# Patient Record
Sex: Female | Born: 1990 | Race: Black or African American | Hispanic: No | Marital: Single | Smoking: Former smoker
Health system: Southern US, Community
[De-identification: ages and names within clinical notes are randomized; demographics above are authoritative.]

## PROBLEM LIST (undated history)

## (undated) ENCOUNTER — Inpatient Hospital Stay: Payer: Self-pay

## (undated) DIAGNOSIS — R87629 Unspecified abnormal cytological findings in specimens from vagina: Secondary | ICD-10-CM

## (undated) DIAGNOSIS — Z789 Other specified health status: Secondary | ICD-10-CM

## (undated) DIAGNOSIS — D649 Anemia, unspecified: Secondary | ICD-10-CM

## (undated) DIAGNOSIS — F99 Mental disorder, not otherwise specified: Secondary | ICD-10-CM

## (undated) DIAGNOSIS — J45909 Unspecified asthma, uncomplicated: Secondary | ICD-10-CM

## (undated) HISTORY — PX: NO PAST SURGERIES: SHX2092

## (undated) HISTORY — DX: Anemia, unspecified: D64.9

## (undated) HISTORY — DX: Unspecified abnormal cytological findings in specimens from vagina: R87.629

## (undated) HISTORY — DX: Unspecified asthma, uncomplicated: J45.909

## (undated) HISTORY — DX: Mental disorder, not otherwise specified: F99

---
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
## 2012-08-30 ENCOUNTER — Emergency Department: Payer: Self-pay | Admitting: Emergency Medicine

## 2012-08-30 LAB — URINALYSIS, COMPLETE
Glucose,UR: NEGATIVE mg/dL (ref 0–75)
Nitrite: NEGATIVE
Protein: 30
RBC,UR: 3 /HPF (ref 0–5)
Specific Gravity: 1.017 (ref 1.003–1.030)
Squamous Epithelial: 23

## 2012-08-30 LAB — PREGNANCY, URINE: Pregnancy Test, Urine: NEGATIVE m[IU]/mL

## 2012-08-31 ENCOUNTER — Emergency Department: Payer: Self-pay | Admitting: Emergency Medicine

## 2012-08-31 LAB — TROPONIN I: Troponin-I: <0.02 ng/mL

## 2012-08-31 LAB — COMPREHENSIVE METABOLIC PANEL
Alkaline Phosphatase: 65 U/L (ref 50–136)
Anion Gap: 8 (ref 7–16)
BUN: 12 mg/dL (ref 7–18)
Bilirubin,Total: 0.4 mg/dL (ref 0.2–1.0)
Calcium, Total: 9.3 mg/dL (ref 8.5–10.1)
Chloride: 107 mmol/L (ref 98–107)
Co2: 25 mmol/L (ref 21–32)
Creatinine: 1.02 mg/dL (ref 0.60–1.30)
EGFR (African American): >60
EGFR (Non-African Amer.): >60
Osmolality: 279 (ref 275–301)
SGOT(AST): 13 U/L — ABNORMAL LOW (ref 15–37)
Sodium: 140 mmol/L (ref 136–145)
Total Protein: 8.2 g/dL (ref 6.4–8.2)

## 2012-08-31 LAB — CBC WITH DIFFERENTIAL/PLATELET
Basophil %: 0.4 %
Eosinophil #: 0 10*3/uL (ref 0.0–0.7)
Eosinophil %: 0.7 %
Lymphocyte %: 23.3 %
MCHC: 35.4 g/dL (ref 32.0–36.0)
Neutrophil #: 3.4 10*3/uL (ref 1.4–6.5)
Neutrophil %: 63 %
Platelet: 218 10*3/uL (ref 150–440)
RBC: 3.72 10*6/uL — ABNORMAL LOW (ref 3.80–5.20)

## 2012-08-31 LAB — WET PREP, GENITAL

## 2012-08-31 LAB — GC/CHLAMYDIA PROBE AMP

## 2012-08-31 LAB — CK TOTAL AND CKMB (NOT AT ARMC): CK, Total: 58 U/L (ref 21–215)

## 2012-09-02 LAB — URINE CULTURE

## 2012-11-15 ENCOUNTER — Emergency Department: Payer: Self-pay | Admitting: Emergency Medicine

## 2012-11-15 LAB — URINALYSIS, COMPLETE
Bilirubin,UR: NEGATIVE
Blood: NEGATIVE
Ketone: NEGATIVE
Leukocyte Esterase: NEGATIVE
Ph: 7 (ref 4.5–8.0)
Protein: 30
Specific Gravity: 1.023 (ref 1.003–1.030)

## 2012-11-18 ENCOUNTER — Emergency Department: Payer: Self-pay | Admitting: Emergency Medicine

## 2012-11-18 LAB — CBC
HGB: 11.8 g/dL — ABNORMAL LOW (ref 12.0–16.0)
MCH: 31.8 pg (ref 26.0–34.0)
MCV: 90 fL (ref 80–100)
Platelet: 275 10*3/uL (ref 150–440)
RBC: 3.73 10*6/uL — ABNORMAL LOW (ref 3.80–5.20)
RDW: 11.9 % (ref 11.5–14.5)

## 2012-11-18 LAB — URINALYSIS, COMPLETE
Blood: NEGATIVE
Glucose,UR: NEGATIVE mg/dL (ref 0–75)
Nitrite: NEGATIVE
Protein: NEGATIVE
RBC,UR: <1 /HPF (ref 0–5)
Specific Gravity: 1.008 (ref 1.003–1.030)
WBC UR: 3 /HPF (ref 0–5)

## 2012-11-18 LAB — COMPREHENSIVE METABOLIC PANEL
Albumin: 3.6 g/dL (ref 3.4–5.0)
Alkaline Phosphatase: 61 U/L (ref 50–136)
Anion Gap: 10 (ref 7–16)
Chloride: 110 mmol/L — ABNORMAL HIGH (ref 98–107)
Co2: 23 mmol/L (ref 21–32)
Creatinine: 0.63 mg/dL (ref 0.60–1.30)
EGFR (Non-African Amer.): >60
Potassium: 3.4 mmol/L — ABNORMAL LOW (ref 3.5–5.1)
SGPT (ALT): 11 U/L — ABNORMAL LOW (ref 12–78)
Total Protein: 7.7 g/dL (ref 6.4–8.2)

## 2012-11-18 LAB — ETHANOL: Ethanol: 141 mg/dL

## 2012-11-18 LAB — DRUG SCREEN, URINE
Amphetamines, Ur Screen: NEGATIVE (ref ?–1000)
Benzodiazepine, Ur Scrn: NEGATIVE (ref ?–200)
Cocaine Metabolite,Ur ~~LOC~~: NEGATIVE (ref ?–300)
MDMA (Ecstasy)Ur Screen: NEGATIVE (ref ?–500)
Opiate, Ur Screen: NEGATIVE (ref ?–300)
Phencyclidine (PCP) Ur S: NEGATIVE (ref ?–25)

## 2013-02-20 ENCOUNTER — Emergency Department: Payer: Self-pay | Admitting: Internal Medicine

## 2013-02-20 LAB — CBC WITH DIFFERENTIAL/PLATELET
Basophil #: 0 10*3/uL (ref 0.0–0.1)
Basophil %: 0.5 %
Lymphocyte #: 1.5 10*3/uL (ref 1.0–3.6)
Lymphocyte %: 31.1 %
MCH: 31.5 pg (ref 26.0–34.0)
MCV: 91 fL (ref 80–100)
Platelet: 200 10*3/uL (ref 150–440)
WBC: 4.9 10*3/uL (ref 3.6–11.0)

## 2013-02-20 LAB — URINALYSIS, COMPLETE
Bacteria: NONE SEEN
Bilirubin,UR: NEGATIVE
Blood: NEGATIVE
Ketone: NEGATIVE
Protein: NEGATIVE
RBC,UR: 1 /HPF (ref 0–5)
Squamous Epithelial: 6
WBC UR: 5 /HPF (ref 0–5)

## 2013-02-20 LAB — COMPREHENSIVE METABOLIC PANEL
Alkaline Phosphatase: 55 U/L
Anion Gap: 6 — ABNORMAL LOW (ref 7–16)
BUN: 9 mg/dL (ref 7–18)
Bilirubin,Total: 0.2 mg/dL (ref 0.2–1.0)
Calcium, Total: 8.6 mg/dL (ref 8.5–10.1)
Chloride: 106 mmol/L (ref 98–107)
Co2: 27 mmol/L (ref 21–32)
Creatinine: 0.54 mg/dL — ABNORMAL LOW (ref 0.60–1.30)
EGFR (African American): >60
EGFR (Non-African Amer.): >60
Potassium: 3.6 mmol/L (ref 3.5–5.1)
SGOT(AST): 10 U/L — ABNORMAL LOW (ref 15–37)
SGPT (ALT): 8 U/L — ABNORMAL LOW (ref 12–78)
Total Protein: 7.6 g/dL (ref 6.4–8.2)

## 2013-03-02 ENCOUNTER — Emergency Department: Payer: Self-pay | Admitting: Internal Medicine

## 2013-10-07 ENCOUNTER — Emergency Department: Payer: Self-pay | Admitting: Emergency Medicine

## 2013-10-07 LAB — URINALYSIS, COMPLETE
BACTERIA: NONE SEEN
BILIRUBIN, UR: NEGATIVE
Blood: NEGATIVE
Glucose,UR: NEGATIVE mg/dL (ref 0–75)
Ketone: NEGATIVE
Nitrite: NEGATIVE
PROTEIN: NEGATIVE
Ph: 6 (ref 4.5–8.0)
RBC,UR: 5 /HPF (ref 0–5)
SPECIFIC GRAVITY: 1.024 (ref 1.003–1.030)
Squamous Epithelial: 13
WBC UR: 8 /HPF (ref 0–5)

## 2013-10-07 LAB — GC/CHLAMYDIA PROBE AMP

## 2013-10-07 LAB — WET PREP, GENITAL

## 2014-01-06 ENCOUNTER — Emergency Department: Payer: Self-pay | Admitting: Emergency Medicine

## 2014-01-10 ENCOUNTER — Ambulatory Visit: Payer: Self-pay | Admitting: Family Medicine

## 2014-06-11 ENCOUNTER — Emergency Department: Payer: Self-pay | Admitting: Emergency Medicine

## 2014-06-11 LAB — CBC
HCT: 35.1 %
HGB: 11.7 g/dL — ABNORMAL LOW
MCH: 31.5 pg
MCHC: 33.4 g/dL
MCV: 94 fL
Platelet: 217 x10 3/mm 3
RBC: 3.72 X10 6/mm 3 — ABNORMAL LOW
RDW: 12.2 %
WBC: 8.3 x10 3/mm 3

## 2014-06-11 LAB — URINALYSIS, COMPLETE
Bilirubin,UR: NEGATIVE
Blood: NEGATIVE
Glucose,UR: NEGATIVE mg/dL
Leukocyte Esterase: NEGATIVE
Nitrite: POSITIVE
Ph: 8
Protein: 100
RBC,UR: 2 /HPF
Specific Gravity: 1.027
Squamous Epithelial: 4
WBC UR: 2 /HPF

## 2014-06-12 LAB — RAPID INFLUENZA A&B ANTIGENS

## 2014-06-13 LAB — URINE CULTURE

## 2014-09-03 ENCOUNTER — Other Ambulatory Visit: Payer: Self-pay

## 2014-09-03 ENCOUNTER — Emergency Department
Admission: EM | Admit: 2014-09-03 | Discharge: 2014-09-03 | Disposition: A | Discharge status: Home / Self Care | Payer: BLUE CROSS/BLUE SHIELD | Attending: Emergency Medicine | Admitting: Emergency Medicine

## 2014-09-03 DIAGNOSIS — R1110 Vomiting, unspecified: Secondary | ICD-10-CM | POA: Diagnosis not present

## 2014-09-03 DIAGNOSIS — Z3202 Encounter for pregnancy test, result negative: Secondary | ICD-10-CM | POA: Insufficient documentation

## 2014-09-03 DIAGNOSIS — F410 Panic disorder [episodic paroxysmal anxiety] without agoraphobia: Secondary | ICD-10-CM | POA: Diagnosis present

## 2014-09-03 LAB — BASIC METABOLIC PANEL
Anion gap: 2 — ABNORMAL LOW (ref 5–15)
BUN: 11 mg/dL (ref 6–20)
CO2: 25 mmol/L (ref 22–32)
Calcium: 9.1 mg/dL (ref 8.9–10.3)
Chloride: 112 mmol/L — ABNORMAL HIGH (ref 101–111)
Creatinine, Ser: 0.65 mg/dL (ref 0.44–1.00)
GFR calc Af Amer: >60 mL/min (ref >60)
GFR calc non Af Amer: >60 mL/min (ref >60)
Glucose, Bld: 95 mg/dL (ref 65–99)
Potassium: 3.7 mmol/L (ref 3.5–5.1)
SODIUM: 139 mmol/L (ref 135–145)

## 2014-09-03 LAB — CBC
HCT: 34.9 % — ABNORMAL LOW (ref 35.0–47.0)
HEMOGLOBIN: 11.7 g/dL — AB (ref 12.0–16.0)
MCH: 30.8 pg (ref 26.0–34.0)
MCHC: 33.5 g/dL (ref 32.0–36.0)
MCV: 92.1 fL (ref 80.0–100.0)
Platelets: 248 10*3/uL (ref 150–440)
RBC: 3.79 MIL/uL — AB (ref 3.80–5.20)
RDW: 12.6 % (ref 11.5–14.5)
WBC: 4.9 10*3/uL (ref 3.6–11.0)

## 2014-09-03 LAB — POCT PREGNANCY, URINE: Preg Test, Ur: NEGATIVE

## 2014-09-03 NOTE — ED Provider Notes (Signed)
[facility] Emergency Department Provider Note    ____________________________________________  Time seen: 1520  I have reviewed the triage vital signs and the nursing notes.   HISTORY  Chief Complaint Emesis; Panic Attack; Extremity Weakness; and Headache   History limited by: Not Limited   HPI Kelly Roberts is a 24 y.o. female who presents to the emergency department after panic attack. Patient states she was at work when she started developing some chest tightness and shortness of breath. She did have one episode of emesis. This came on suddenly. It lasted roughly 30 minutes. She states she has had panic attacks in the past. Currently she states she feels much improved. No obvious trigger for today's episode. Denies any recent illnesses.     No past medical history on file.  There are no active problems to display for this patient.   No past surgical history on file.  No current outpatient prescriptions on file.  Allergies Review of patient's allergies indicates no known allergies.  No family history on file.  Social History History  Substance Use Topics  . Smoking status: Not on file  . Smokeless tobacco: Not on file  . Alcohol Use: Not on file    Review of Systems  Constitutional: Negative for fever. Cardiovascular: Positive for chest tightness Respiratory: Positive for shortness of breath. Gastrointestinal: Positive for one episode of emesis Genitourinary: Negative for dysuria. Musculoskeletal: Negative for back pain. Skin: Negative for rash. Neurological: Negative for headaches, focal weakness or numbness.   10-point ROS otherwise negative.  ____________________________________________   PHYSICAL EXAM:  VITAL SIGNS: ED Triage Vitals  Enc Vitals Group     BP 09/03/14 1033 102/60 mmHg     Pulse Rate 09/03/14 1033 84     Resp 09/03/14 1033 18     Temp 09/03/14 1033 98.7 F (37.1 C)     Temp Source 09/03/14 1033  Oral     SpO2 09/03/14 1033 99 %     Weight 09/03/14 1033 110 lb (49.896 kg)     Height 09/03/14 1033 4\' 8"  (1.422 m)     Head Cir --      Peak Flow --      Pain Score --      Pain Loc --      Pain Edu? --      Excl. in GC? --      Constitutional: Alert and oriented. Well appearing and in no distress. Eyes: Conjunctivae are normal. PERRL. Normal extraocular movements. ENT   Head: Normocephalic and atraumatic.   Nose: No congestion/rhinnorhea.   Mouth/Throat: Mucous membranes are moist.   Neck: No stridor. Hematological/Lymphatic/Immunilogical: No cervical lymphadenopathy. Cardiovascular: Normal rate, regular rhythm.  No murmurs, rubs, or gallops. Respiratory: Normal respiratory effort without tachypnea nor retractions. Breath sounds are clear and equal bilaterally. No wheezes/rales/rhonchi. Gastrointestinal: Soft and nontender. No distention.  Genitourinary: Deferred Musculoskeletal: Normal range of motion in all extremities. No joint effusions.  No lower extremity tenderness nor edema. Neurologic:  Normal speech and language. No gross focal neurologic deficits are appreciated. Speech is normal.  Skin:  Skin is warm, dry and intact. No rash noted. Psychiatric: Mood and affect are normal. Speech and behavior are normal. Patient exhibits appropriate insight and judgment.  ____________________________________________    LABS (pertinent positives/negatives)  Labs Reviewed  BASIC METABOLIC PANEL - Abnormal; Notable for the following:    Chloride 112 (*)    Anion gap 2 (*)    All other components within normal  limits  CBC - Abnormal; Notable for the following:    RBC 3.79 (*)    Hemoglobin 11.7 (*)    HCT 34.9 (*)    All other components within normal limits  POCT PREGNANCY, URINE  POC URINE PREG, ED     ____________________________________________   EKG  I, Phineas Semen, attending physician, personally viewed and interpreted this EKG  EKG Time:  1055 Rate: 73 Rhythm: Normal sinus rhythm Axis: Normal Intervals: QTC 418 QRS: Narrow ST changes: No ST elevation    ____________________________________________    RADIOLOGY  None  ____________________________________________   PROCEDURES  Procedure(s) performed: None  Critical Care performed: No  ____________________________________________   INITIAL IMPRESSION / ASSESSMENT AND PLAN / ED COURSE  Pertinent labs & imaging results that were available during my care of the patient were reviewed by me and considered in my medical decision making (see chart for details).  Patient presents after panic attack today at work. On exam here no concerning findings. Blood work without any concerning findings. Discussed panic attacks with patient. Will discharge home, will give [facility] follow-up. Encourage primary care follow-up.  ____________________________________________   FINAL CLINICAL IMPRESSION(S) / ED DIAGNOSES  Final diagnoses:  Panic attack     Phineas Semen, MD 09/03/14 1544

## 2014-09-03 NOTE — ED Notes (Signed)
Pt eating chips. 

## 2014-09-03 NOTE — ED Notes (Signed)
Patient was at work when she had an episode of vomiting followed by a panic attack. Patient states that she is now ShOB, with left arm weakness, and a headache

## 2014-09-03 NOTE — ED Notes (Signed)
Had panic attack at work, still has some sob , weakness in left arm, c/o some headache

## 2014-09-03 NOTE — Discharge Instructions (Signed)
Please seek medical attention for any high fevers, chest pain, shortness of breath, change in behavior, persistent vomiting, bloody stool or any other new or concerning symptoms.  Panic Attacks Panic attacks are sudden, short-livedsurges of severe anxiety, fear, or discomfort. They may occur for no reason when you are relaxed, when you are anxious, or when you are sleeping. Panic attacks may occur for a number of reasons:   Healthy people occasionally have panic attacks in extreme, life-threatening situations, such as war or natural disasters. Normal anxiety is a protective mechanism of the body that helps [place] react to danger (fight or flight response).  Panic attacks are often seen with anxiety disorders, such as panic disorder, social anxiety disorder, generalized anxiety disorder, and phobias. Anxiety disorders cause excessive or uncontrollable anxiety. They may interfere with your relationships or other life activities.  Panic attacks are sometimes seen with other mental illnesses, such as depression and posttraumatic stress disorder.  Certain medical conditions, prescription medicines, and drugs of abuse can cause panic attacks. SYMPTOMS  Panic attacks start suddenly, peak within 20 minutes, and are accompanied by four or more of the following symptoms:  Pounding heart or fast heart rate (palpitations).  Sweating.  Trembling or shaking.  Shortness of breath or feeling smothered.  Feeling choked.  Chest pain or discomfort.  Nausea or strange feeling in your stomach.  Dizziness, light-headedness, or feeling like you will faint.  Chills or hot flushes.  Numbness or tingling in your lips or hands and feet.  Feeling that things are not real or feeling that you are not yourself.  Fear of losing control or going crazy.  Fear of dying. Some of these symptoms can mimic serious medical conditions. For example, you may think you are having a heart attack. Although panic attacks can  be very scary, they are not life threatening. DIAGNOSIS  Panic attacks are diagnosed through an assessment by your health care provider. Your health care provider will ask questions about your symptoms, such as where and when they occurred. Your health care provider will also ask about your medical history and use of alcohol and drugs, including prescription medicines. Your health care provider may order blood tests or other studies to rule out a serious medical condition. Your health care provider may refer you to a mental health professional for further evaluation. TREATMENT   Most healthy people who have one or two panic attacks in an extreme, life-threatening situation will not require treatment.  The treatment for panic attacks associated with anxiety disorders or other mental illness typically involves counseling with a mental health professional, medicine, or a combination of both. Your health care provider will help determine what treatment is best for you.  Panic attacks due to physical illness usually go away with treatment of the illness. If prescription medicine is causing panic attacks, talk with your health care provider about stopping the medicine, decreasing the dose, or substituting another medicine.  Panic attacks due to alcohol or drug abuse go away with abstinence. Some adults need professional help in order to stop drinking or using drugs. HOME CARE INSTRUCTIONS   Take all medicines as directed by your health care provider.   Schedule and attend follow-up visits as directed by your health care provider. It is important to keep all your appointments. SEEK MEDICAL CARE IF:  You are not able to take your medicines as prescribed.  Your symptoms do not improve or get worse. SEEK IMMEDIATE MEDICAL CARE IF:   You experience panic  attack symptoms that are different than your usual symptoms.  You have serious thoughts about hurting yourself or others.  You are taking medicine  for panic attacks and have a serious side effect. MAKE SURE YOU:  Understand these instructions.  Will watch your condition.  Will get help right away if you are not doing well or get worse. Document Released: 03/07/2005 Document Revised: 03/12/2013 Document Reviewed: 10/19/2012 [facility] Patient Information 2015 [employer] [place]. This information is not intended to replace advice given to you by your health care provider. Make sure you discuss any questions you have with your health care provider.

## 2014-10-05 ENCOUNTER — Emergency Department
Admission: EM | Admit: 2014-10-05 | Discharge: 2014-10-05 | Disposition: A | Discharge status: Home / Self Care | Payer: BLUE CROSS/BLUE SHIELD | Attending: Emergency Medicine | Admitting: Emergency Medicine

## 2014-10-05 ENCOUNTER — Emergency Department: Payer: BLUE CROSS/BLUE SHIELD

## 2014-10-05 ENCOUNTER — Encounter: Payer: Self-pay | Admitting: Emergency Medicine

## 2014-10-05 DIAGNOSIS — R10813 Right lower quadrant abdominal tenderness: Secondary | ICD-10-CM | POA: Insufficient documentation

## 2014-10-05 DIAGNOSIS — R10814 Left lower quadrant abdominal tenderness: Secondary | ICD-10-CM | POA: Insufficient documentation

## 2014-10-05 DIAGNOSIS — Z331 Pregnant state, incidental: Secondary | ICD-10-CM | POA: Insufficient documentation

## 2014-10-05 DIAGNOSIS — R109 Unspecified abdominal pain: Secondary | ICD-10-CM | POA: Diagnosis present

## 2014-10-05 DIAGNOSIS — Z3490 Encounter for supervision of normal pregnancy, unspecified, unspecified trimester: Secondary | ICD-10-CM

## 2014-10-05 LAB — URINALYSIS COMPLETE WITH MICROSCOPIC (ARMC ONLY)
BILIRUBIN URINE: NEGATIVE
Bacteria, UA: NONE SEEN
GLUCOSE, UA: NEGATIVE mg/dL
Hgb urine dipstick: NEGATIVE
Ketones, ur: NEGATIVE mg/dL
LEUKOCYTES UA: NEGATIVE
Nitrite: NEGATIVE
Protein, ur: 30 mg/dL — AB
RBC / HPF: NONE SEEN RBC/hpf (ref 0–5)
Specific Gravity, Urine: 1.031 — ABNORMAL HIGH (ref 1.005–1.030)
WBC, UA: NONE SEEN WBC/hpf (ref 0–5)
pH: 5 (ref 5.0–8.0)

## 2014-10-05 LAB — HCG, QUANTITATIVE, PREGNANCY: hCG, Beta Chain, Quant, S: 13967 m[IU]/mL — ABNORMAL HIGH (ref <5)

## 2014-10-05 MED ORDER — PROMETHAZINE HCL 25 MG PO TABS: 25.0000 mg | ORAL_TABLET | Freq: Four times a day (QID) | ORAL | Status: DC | PRN

## 2014-10-05 NOTE — ED Notes (Signed)
Pt presents to the ER from home with complaints of lower abdominal cramping for about 3 days. Pt reports last time she had her period was 09/18/14

## 2014-10-05 NOTE — ED Provider Notes (Signed)
[facility] Emergency Department Provider Note  ____________________________________________  Time seen: Approximately 10:30 AM  I have reviewed the triage vital signs and the nursing notes.   HISTORY  Chief Complaint Abdominal Cramping and Possible Pregnancy   HPI Kelly Roberts is a 24 y.o. female presents emergency room with complaints of lower abdominal cramping for about 3 days. Patient states that her last period was approximately 08/23/2014. Currently 4 weeks IUP diagnosed by the health department complaints of severe pain. Denies any vaginal bleeding at this time.   History reviewed. No pertinent past medical history.  There are no active problems to display for this patient.   History reviewed. No pertinent past surgical history.  Current Outpatient Rx  Name  Route  Sig  Dispense  Refill  . promethazine (PHENERGAN) 25 MG tablet   Oral   Take 1 tablet (25 mg total) by mouth every 6 (six) hours as needed for nausea or vomiting.   10 tablet   0     Allergies Review of patient's allergies indicates no known allergies.  No family history on file.  Social History History  Substance Use Topics  . Smoking status: Never Smoker   . Smokeless tobacco: Not on file  . Alcohol Use: Not on file    Review of Systems Constitutional: No fever/chills Eyes: No visual changes. ENT: No sore throat. Cardiovascular: Denies chest pain. Respiratory: Denies shortness of breath. Gastrointestinal: Positive for abdominal pain.  No nausea, no vomiting.  No diarrhea.  No constipation. Genitourinary: Negative for dysuria. Musculoskeletal: Negative for back pain. Skin: Negative for rash. Neurological: Negative for headaches, focal weakness or numbness.  10-point ROS otherwise negative.  ____________________________________________   PHYSICAL EXAM:  VITAL SIGNS: ED Triage Vitals  Enc Vitals Group     BP 10/05/14 0950 107/52 mmHg     Pulse Rate  10/05/14 0950 89     Resp 10/05/14 0950 20     Temp 10/05/14 0950 98.1 F (36.7 C)     Temp Source 10/05/14 0950 Oral     SpO2 10/05/14 0950 99 %     Weight 10/05/14 0950 110 lb (49.896 kg)     Height 10/05/14 0950 4\' 8"  (1.422 m)     Head Cir --      Peak Flow --      Pain Score 10/05/14 0951 5     Pain Loc --      Pain Edu? --      Excl. in GC? --     Constitutional: Alert and oriented. Well appearing and in no acute distress. Eyes: Conjunctivae are normal. PERRL. EOMI. Head: Atraumatic. Nose: No congestion/rhinnorhea. Mouth/Throat: Mucous membranes are moist.  Oropharynx non-erythematous. Neck: No stridor.   Cardiovascular: Normal rate, regular rhythm. Grossly normal heart sounds.  Good peripheral circulation. Respiratory: Normal respiratory effort.  No retractions. Lungs CTAB. Gastrointestinal: Soft. Tender inright lower quadrant and left lower quadrant.. No distention. No abdominal bruits. No CVA tenderness. Genitourinary: Deferred Musculoskeletal: No lower extremity tenderness nor edema.  No joint effusions. Neurologic:  Normal speech and language. No gross focal neurologic deficits are appreciated. No gait instability. Skin:  Skin is warm, dry and intact. No rash noted. Psychiatric: Mood and affect are normal. Speech and behavior are normal.  ____________________________________________   LABS (all labs ordered are listed, but only abnormal results are displayed)  Labs Reviewed  URINALYSIS COMPLETEWITH MICROSCOPIC ([facility] ONLY) - Abnormal; Notable for the following:    Color, Urine YELLOW (*)  APPearance CLEAR (*)    Specific Gravity, Urine 1.031 (*)    Protein, ur 30 (*)    Squamous Epithelial / LPF 0-5 (*)    All other components within normal limits  HCG, QUANTITATIVE, PREGNANCY - Abnormal; Notable for the following:    hCG, Beta Chain, Quant, S [id number] (*)    All other components within normal limits  POC URINE PREG, ED    ____________________________________________   RADIOLOGY  Transvaginal ultrasound demonstrates intact sac for pregnancy. Interpreted by radiologist ____________________________________________   PROCEDURES  Procedure(s) performed: None  Critical Care performed: No  ____________________________________________   INITIAL IMPRESSION / ASSESSMENT AND PLAN / ED COURSE  Pertinent labs & imaging results that were available during my care of the patient were reviewed by me and considered in my medical decision making (see chart for details).  Intrauterine pregnancy confirmed by quantitative hCG and ultrasound. Patient to repeat ultrasound in 14 days to confirm continuation of pregnancy. She is scheduled to see [facility] within the next 30 days. She denies any other complaints at this time will discharge her with a prescription for Phenergan to use as needed for nausea. Encouraged Tylenol for cramping. Patient voices understanding and will return to the ER with any worsening symptomology. ____________________________________________   FINAL CLINICAL IMPRESSION(S) / ED DIAGNOSES  Final diagnoses:  Pregnancy      Evangeline Dakin, PA-C 10/05/14 1358  Darci Current, MD 10/07/14 [phone]

## 2014-10-05 NOTE — Discharge Instructions (Signed)
First Trimester of Pregnancy °The first trimester of pregnancy is from week 1 until the end of week 12 (months 1 through 3). A week after a sperm fertilizes an egg, the egg will implant on the wall of the uterus. This embryo will begin to develop into a baby. Genes from you and your partner are forming the baby. The female genes determine whether the baby is a boy or a girl. At 6-8 weeks, the eyes and face are formed, and the heartbeat can be seen on ultrasound. At the end of 12 weeks, all the baby's organs are formed.  °Now that you are pregnant, you will want to do everything you can to have a healthy baby. Two of the most important things are to get good prenatal care and to follow your health care provider's instructions. Prenatal care is all the medical care you receive before the baby's birth. This care will help prevent, find, and treat any problems during the pregnancy and childbirth. °BODY CHANGES °Your body goes through many changes during pregnancy. The changes vary from woman to woman.  °· You may gain or lose a couple of pounds at first. °· You may feel sick to your stomach (nauseous) and throw up (vomit). If the vomiting is uncontrollable, call your health care provider. °· You may tire easily. °· You may develop headaches that can be relieved by medicines approved by your health care provider. °· You may urinate more often. Painful urination may mean you have a bladder infection. °· You may develop heartburn as a result of your pregnancy. °· You may develop constipation because certain hormones are causing the muscles that push waste through your intestines to slow down. °· You may develop hemorrhoids or swollen, bulging veins (varicose veins). °· Your breasts may begin to grow larger and become tender. Your nipples may stick out more, and the tissue that surrounds them (areola) may become darker. °· Your gums may bleed and may be sensitive to brushing and flossing. °· Dark spots or blotches (chloasma,  mask of pregnancy) may develop on your face. This will likely fade after the baby is born. °· Your menstrual periods will stop. °· You may have a loss of appetite. °· You may develop cravings for certain kinds of food. °· You may have changes in your emotions from day to day, such as being excited to be pregnant or being concerned that something may go wrong with the pregnancy and baby. °· You may have more vivid and strange dreams. °· You may have changes in your hair. These can include thickening of your hair, rapid growth, and changes in texture. Some women also have hair loss during or after pregnancy, or hair that feels dry or thin. Your hair will most likely return to normal after your baby is born. °WHAT TO EXPECT AT YOUR PRENATAL VISITS °During a routine prenatal visit: °· You will be weighed to make sure you and the baby are growing normally. °· Your blood pressure will be taken. °· Your abdomen will be measured to track your baby's growth. °· The fetal heartbeat will be listened to starting around week 10 or 12 of your pregnancy. °· Test results from any previous visits will be discussed. °Your health care provider may ask you: °· How you are feeling. °· If you are feeling the baby move. °· If you have had any abnormal symptoms, such as leaking fluid, bleeding, severe headaches, or abdominal cramping. °· If you have any questions. °Other tests   that may be performed during your first trimester include: °· Blood tests to find your blood type and to check for the presence of any previous infections. They will also be used to check for low iron levels (anemia) and Rh antibodies. Later in the pregnancy, blood tests for diabetes will be done along with other tests if problems develop. °· Urine tests to check for infections, diabetes, or protein in the urine. °· An ultrasound to confirm the proper growth and development of the baby. °· An amniocentesis to check for possible genetic problems. °· Fetal screens for  spina bifida and Down syndrome. °· You may need other tests to make sure you and the baby are doing well. °HOME CARE INSTRUCTIONS  °Medicines °· Follow your health care provider's instructions regarding medicine use. Specific medicines may be either safe or unsafe to take during pregnancy. °· Take your prenatal vitamins as directed. °· If you develop constipation, try taking a stool softener if your health care provider approves. °Diet °· Eat regular, well-balanced meals. Choose a variety of foods, such as meat or vegetable-based protein, fish, milk and low-fat dairy products, vegetables, fruits, and whole grain breads and cereals. Your health care provider will help you determine the amount of weight gain that is right for you. °· Avoid raw meat and uncooked cheese. These carry germs that can cause birth defects in the baby. °· Eating four or five small meals rather than three large meals a day may help relieve nausea and vomiting. If you start to feel nauseous, eating a few soda crackers can be helpful. Drinking liquids between meals instead of during meals also seems to help nausea and vomiting. °· If you develop constipation, eat more high-fiber foods, such as fresh vegetables or fruit and whole grains. Drink enough fluids to keep your urine clear or pale yellow. °Activity and Exercise °· Exercise only as directed by your health care provider. Exercising will help you: °¨ Control your weight. °¨ Stay in shape. °¨ Be prepared for labor and delivery. °· Experiencing pain or cramping in the lower abdomen or low back is a good sign that you should stop exercising. Check with your health care provider before continuing normal exercises. °· Try to avoid standing for long periods of time. Move your legs often if you must stand in one place for a long time. °· Avoid heavy lifting. °· Wear low-heeled shoes, and practice good posture. °· You may continue to have sex unless your health care provider directs you  otherwise. °Relief of Pain or Discomfort °· Wear a good support bra for breast tenderness.   °· Take warm sitz baths to soothe any pain or discomfort caused by hemorrhoids. Use hemorrhoid cream if your health care provider approves.   °· Rest with your legs elevated if you have leg cramps or low back pain. °· If you develop varicose veins in your legs, wear support hose. Elevate your feet for 15 minutes, 3-4 times a day. Limit salt in your diet. °Prenatal Care °· Schedule your prenatal visits by the twelfth week of pregnancy. They are usually scheduled monthly at first, then more often in the last 2 months before delivery. °· Write down your questions. Take them to your prenatal visits. °· Keep all your prenatal visits as directed by your health care provider. °Safety °· Wear your seat belt at all times when driving. °· Make a list of emergency phone numbers, including numbers for family, friends, the hospital, and police and fire departments. °General Tips °·   Ask your health care provider for a referral to a local prenatal education class. Begin classes no later than at the beginning of month 6 of your pregnancy.  Ask for help if you have counseling or nutritional needs during pregnancy. Your health care provider can offer advice or refer you to specialists for help with various needs.  Do not use hot tubs, steam rooms, or saunas.  Do not douche or use tampons or scented sanitary pads.  Do not cross your legs for long periods of time.  Avoid cat litter boxes and soil used by cats. These carry germs that can cause birth defects in the baby and possibly loss of the fetus by miscarriage or stillbirth.  Avoid all smoking, herbs, alcohol, and medicines not prescribed by your health care provider. Chemicals in these affect the formation and growth of the baby.  Schedule a dentist appointment. At home, brush your teeth with a soft toothbrush and be gentle when you floss. SEEK MEDICAL CARE IF:   You have  dizziness.  You have mild pelvic cramps, pelvic pressure, or nagging pain in the abdominal area.  You have persistent nausea, vomiting, or diarrhea.  You have a bad smelling vaginal discharge.  You have pain with urination.  You notice increased swelling in your face, hands, legs, or ankles. SEEK IMMEDIATE MEDICAL CARE IF:   You have a fever.  You are leaking fluid from your vagina.  You have spotting or bleeding from your vagina.  You have severe abdominal cramping or pain.  You have rapid weight gain or loss.  You vomit blood or material that looks like coffee grounds.  You are exposed to [place] and have never had them.  You are exposed to fifth disease or chickenpox.  You develop a severe headache.  You have shortness of breath.  You have any kind of trauma, such as from a fall or a car accident. Document Released: 03/01/2001 Document Revised: 07/22/2013 Document Reviewed: 01/15/2013 [facility] Patient Information 2015 [place]. This information is not intended to replace advice given to you by your health care provider. Make sure you discuss any questions you have with your health care provider.  Medicines During Pregnancy During pregnancy, there are medicines that are either safe or unsafe to take. Medicines include prescriptions from your caregiver, over-the-counter medicines, topical creams applied to the skin, and all herbal substances. Medicines are put into either Class A, B, C, or D. Class A and B medicines have been shown to be safe in pregnancy. Class C medicines are also considered to be safe in pregnancy, but these medicines should only be used when necessary. Class D medicines should not be used at all in pregnancy. They can be harmful to a baby.  It is best to take as little medicine as possible while pregnant. However, some medicines are necessary to take for the mother and baby's health. Sometimes, it is more dangerous to stop taking certain  medicines than to stay on them. This is often the case for people with long-term (chronic) conditions such as asthma, diabetes, or high blood pressure (hypertension). If you are pregnant and have a chronic illness, call your caregiver right away. Bring a list of your medicines and their doses to your appointments. If you are planning to become pregnant, schedule a doctor's appointment and discuss your medicines with your caregiver. Lastly, write down the phone number to your pharmacist. They can answer questions regarding a medicine's class and safety. They cannot give advice  as to whether you should or should not be on a medicine.  SAFE AND UNSAFE MEDICINES There is a long list of medicines that are considered safe in pregnancy. Below is a shorter list. For specific medicines, ask your caregiver.  AllergyMedicines Loratadine, cetirizine, and chlorpheniramine are safe to take. Certain nasal steroid sprays are safe. Talk to your caregiver about specific brands that are safe. Analgesics Acetaminophen and acetaminophen with codeine are safe to take. All other nonsteroidal anti-inflammatory drugs (NSAIDS) are not safe. This includes ibuprofen.  Antacids Many over-the-counter antacids are safe to take. Talk to your caregiver about specific brands that are safe. Famotidine, ranitidine, and lansoprazole are safe. Omepresole is considered safe to take in the second trimester. Antibiotic Medicines There are several antibiotics to avoid. These include, but are not limited to, tetracyline, quinolones, and sulfa medications. Talk to your caregiver before taking any antibiotic.  Antihistamines Talk to your caregiver about specific brands that are safe.  Asthma Medicines Most asthma steroid inhalers are safe to take. Talk to your caregiver for specific details. Calcium Calcium supplements are safe to take. Do not take oyster shell calcium.  Cough and Cold Medicines It is safe to take products with  guaifenesin or dextromethorphan. Talk to your caregiver about specific brands that are safe. It is not safe to take products that contain aspirin or ibuprofen. Decongestant Medicines Pseudoephedrine-containing products are safe to take in the second and third trimester.  Depression Medicines Talk about these medicines with your caregiver.  Antidiarrheal Medicines It is safe to take loperamide. Talk to your caregiver about specific brands that are safe. It is not safe to take any antidiarrheal medicine that contains bismuth. Eyedrops Allergy eyedrops should be limited.  Iron It is safe to use certain iron-containing medicines for anemia in pregnancy. They require a prescription.  Antinausea Medicines It is safe to take doxylamine and vitamin B6 as directed. There are other prescription medicines available, if needed.  Sleep aids It is safe to take diphenhydramine and acetaminophen with diphenhydramine.  Steroids Hydrocortisone creams are safe to use as directed. Oral steroids require a prescription. It is not safe to take any hemorrhoid cream with pramoxine or phenylephrine. Stool softener It is safe to take stool softener medicines. Avoid daily or prolonged use of stool softeners. Thyroid Medicine It is important to stay on this thyroid medicine. It needs to be followed by your caregiver.  Vaginal Medicines Your caregiver will prescribe a medicine to you if you have a vaginal infection. Certain antifungal medicines are safe to use if you have a sexually transmitted infection (STI). Talk to your caregiver.  Document Released: 03/07/2005 Document Revised: 05/30/2011 Document Reviewed: 03/08/2011 [facility] Patient Information 2015 [place], [place]. This information is not intended to replace advice given to you by your health care provider. Make sure you discuss any questions you have with your health care provider.  Prenatal Care  WHAT IS PRENATAL CARE?  Prenatal care means health  care during your pregnancy, before your baby is born. It is very important to take care of yourself and your baby during your pregnancy by:   Getting early prenatal care. If you know you are pregnant, or think you might be pregnant, call your health care provider as soon as possible. Schedule a visit for a prenatal exam.  Getting regular prenatal care. Follow your health care provider's schedule for blood and other necessary tests. Do not miss appointments.  Doing everything you can to keep yourself and your baby healthy during  your pregnancy.  Getting complete care. Prenatal care should include evaluation of the medical, dietary, educational, psychological, and social needs of you and your significant other. The medical and genetic history of your family and the family of your baby's father should be discussed with your health care provider.  Discussing with your health care provider:  Prescription, over-the-counter, and herbal medicines that you take.  Any history of substance abuse, alcohol use, smoking, and illegal drug use.  Any history of domestic abuse and violence.  Immunizations you have received.  Your nutrition and diet.  The amount of exercise you do.  Any environmental and occupational hazards to which you are exposed.  History of sexually transmitted infections for both you and your partner.  Previous pregnancies you have had. WHY IS PRENATAL CARE SO IMPORTANT?  By regularly seeing your health care provider, you help ensure that problems can be identified early so that they can be treated as soon as possible. Other problems might be prevented. Many studies have shown that early and regular prenatal care is important for the health of mothers and their babies.  HOW CAN I TAKE CARE OF MYSELF WHILE I AM PREGNANT?  Here are ways to take care of yourself and your baby:   Start or continue taking your multivitamin with 400 micrograms (mcg) of folic acid every day.  Get  early and regular prenatal care. It is very important to see a health care provider during your pregnancy. Your health care provider will check at each visit to make sure that you and your baby are healthy. If there are any problems, action can be taken right away to help you and your baby.  Eat a healthy diet that includes:  Fruits.  Vegetables.  Foods low in saturated fat.  Whole grains.  Calcium-rich foods, such as milk, yogurt, and hard cheeses.  Drink 6-8 glasses of liquids a day.  Unless your health care provider tells you not to, try to be physically active for 30 minutes, most days of the week. If you are pressed for time, you can get your activity in through 10-minute segments, three times a day.  Do not smoke, drink alcohol, or use drugs. These can cause long-term damage to your baby. Talk with your health care provider about steps to take to stop smoking. Talk with a member of your faith community, a counselor, a trusted friend, or your health care provider if you are concerned about your alcohol or drug use.  Ask your health care provider before taking any medicine, even over-the-counter medicines. Some medicines are not safe to take during pregnancy.  Get plenty of rest and sleep.  Avoid hot tubs and saunas during pregnancy.  Do not have X-rays taken unless absolutely necessary and with the recommendation of your health care provider. A lead shield can be placed on your abdomen to protect your baby when X-rays are taken in other parts of your body.  Do not empty the cat litter when you are pregnant. It may contain a parasite that causes an infection called toxoplasmosis, which can cause birth defects. Also, use gloves when working in garden areas used by cats.  Do not eat uncooked or undercooked meats or fish.  Do not eat soft, mold-ripened cheeses (Brie, Camembert, and chevre) or soft, blue-veined cheese (Danish blue and Roquefort).  Stay away from toxic chemicals  like:  Insecticides.  Solvents (some cleaners or paint thinners).  Lead.  Mercury.  Sexual intercourse may continue until the end  of the pregnancy, unless you have a medical problem or there is a problem with the pregnancy and your health care provider tells you not to.  Do not wear high-heel shoes, especially during the second half of the pregnancy. You can lose your balance and fall.  Do not take long trips, unless absolutely necessary. Be sure to see your health care provider before going on the trip.  Do not sit in one position for more than 2 hours when on a trip.  Take a copy of your medical records when going on a trip. Know where a hospital is located in the city you are visiting, in case of an emergency.  Most dangerous household products will have pregnancy warnings on their labels. Ask your health care provider about products if you are unsure.  Limit or eliminate your caffeine intake from coffee, tea, sodas, medicines, and chocolate.  Many women continue working through pregnancy. Staying active might help you stay healthier. If you have a question about the safety or the hours you work at your particular job, talk with your health care provider.  Get informed:  Read books.  Watch videos.  Go to childbirth classes for you and your significant other.  Talk with experienced moms.  Ask your health care provider about childbirth education classes for you and your partner. Classes can help you and your partner prepare for the birth of your baby.  Ask about a baby doctor (pediatrician) and methods and pain medicine for labor, delivery, and possible cesarean delivery. HOW OFTEN SHOULD I SEE MY HEALTH CARE PROVIDER DURING PREGNANCY?  Your health care provider will give you a schedule for your prenatal visits. You will have visits more often as you get closer to the end of your pregnancy. An average pregnancy lasts about 40 weeks.  A typical schedule includes visiting your  health care provider:   About once each month during your first 6 months of pregnancy.  Every 2 weeks during the next 2 months.  Weekly in the last month, until the delivery date. Your health care provider will probably want to see you more often if:  You are older than 35 years.  Your pregnancy is high risk because you have certain health problems or problems with the pregnancy, such as:  Diabetes.  High blood pressure.  The baby is not growing on schedule, according to the dates of the pregnancy. Your health care provider will do special tests to make sure you and your baby are not having any serious problems. WHAT HAPPENS DURING PRENATAL VISITS?   At your first prenatal visit, your health care provider will do a physical exam and talk to you about your health history and the health history of your partner and your family. Your health care provider will be able to tell you what date to expect your baby to be born on.  Your first physical exam will include checks of your blood pressure, measurements of your height and weight, and an exam of your pelvic organs. Your health care provider will do a Pap test if you have not had one recently and will do cultures of your cervix to make sure there is no infection.  At each prenatal visit, there will be tests of your blood, urine, blood pressure, weight, and the progress of the baby will be checked.  At your later prenatal visits, your health care provider will check how you are doing and how your baby is developing. You may have a number  of tests done as your pregnancy progresses.  Ultrasound exams are often used to check on your baby's growth and health.  You may have more urine and blood tests, as well as special tests, if needed. These may include amniocentesis to examine fluid in the pregnancy sac, stress tests to check how the baby responds to contractions, or a biophysical profile to measure your baby's well-being. Your health care  provider will explain the tests and why they are necessary.  You should be tested for high blood sugar (gestational diabetes) between the 24th and 28th weeks of your pregnancy.  You should discuss with your health care provider your plans to breastfeed or bottle-feed your baby.  Each visit is also a chance for you to learn about staying healthy during pregnancy and to ask questions. Document Released: 03/10/2003 Document Revised: 03/12/2013 Document Reviewed: 05/22/2013 [facility] Patient Information 2015 [place], [place]. This information is not intended to replace advice given to you by your health care provider. Make sure you discuss any questions you have with your health care provider.

## 2014-10-05 NOTE — ED Notes (Signed)
Pt to Ultrasound

## 2014-10-05 NOTE — ED Notes (Signed)
NAD noted at time of D/C. Pt refused wheelchair to the lobby at this time.

## 2014-10-27 ENCOUNTER — Encounter: Payer: Self-pay | Admitting: Urgent Care

## 2014-10-27 DIAGNOSIS — R109 Unspecified abdominal pain: Secondary | ICD-10-CM | POA: Insufficient documentation

## 2014-10-27 DIAGNOSIS — O210 Mild hyperemesis gravidarum: Secondary | ICD-10-CM | POA: Diagnosis present

## 2014-10-27 DIAGNOSIS — O9989 Other specified diseases and conditions complicating pregnancy, childbirth and the puerperium: Secondary | ICD-10-CM | POA: Insufficient documentation

## 2014-10-27 DIAGNOSIS — Z79899 Other long term (current) drug therapy: Secondary | ICD-10-CM | POA: Insufficient documentation

## 2014-10-27 DIAGNOSIS — M549 Dorsalgia, unspecified: Secondary | ICD-10-CM | POA: Insufficient documentation

## 2014-10-27 DIAGNOSIS — Z3A10 10 weeks gestation of pregnancy: Secondary | ICD-10-CM | POA: Diagnosis not present

## 2014-10-27 LAB — COMPREHENSIVE METABOLIC PANEL
ALK PHOS: 57 U/L
ALT: 7 U/L — AB
Albumin: 4.8 g/dL
Anion Gap: 8 (ref 7–16)
BILIRUBIN TOTAL: 0.4 mg/dL
BUN: 12 mg/dL
CALCIUM: 8.9 mg/dL
CHLORIDE: 101 mmol/L
CO2: 26 mmol/L
CREATININE: 0.67 mg/dL
EGFR (African American): >60
Glucose: 96 mg/dL
Potassium: 3 mmol/L — ABNORMAL LOW
SGOT(AST): 18 U/L
SODIUM: 135 mmol/L
Total Protein: 8.1 g/dL

## 2014-10-27 NOTE — ED Notes (Addendum)
Patient presents with c/o RIGHT lower back pain with (+) N/V that began earlier today. Denies dysuria. Of note, patient reports that she is a 10 week G2P1; denies vaginal bleeding and abdominal pain.

## 2014-10-28 ENCOUNTER — Emergency Department: Payer: BLUE CROSS/BLUE SHIELD

## 2014-10-28 ENCOUNTER — Emergency Department
Admission: EM | Admit: 2014-10-28 | Discharge: 2014-10-28 | Disposition: A | Discharge status: Home / Self Care | Payer: BLUE CROSS/BLUE SHIELD | Attending: Emergency Medicine | Admitting: Emergency Medicine

## 2014-10-28 DIAGNOSIS — R109 Unspecified abdominal pain: Secondary | ICD-10-CM

## 2014-10-28 DIAGNOSIS — O210 Mild hyperemesis gravidarum: Secondary | ICD-10-CM

## 2014-10-28 LAB — URINALYSIS COMPLETE WITH MICROSCOPIC (ARMC ONLY)
BACTERIA UA: NONE SEEN
Bilirubin Urine: NEGATIVE
Glucose, UA: NEGATIVE mg/dL
Hgb urine dipstick: NEGATIVE
Ketones, ur: NEGATIVE mg/dL
NITRITE: NEGATIVE
PH: 6 (ref 5.0–8.0)
Protein, ur: NEGATIVE mg/dL
Specific Gravity, Urine: 1.023 (ref 1.005–1.030)

## 2014-10-28 LAB — COMPREHENSIVE METABOLIC PANEL
ALBUMIN: 4 g/dL (ref 3.5–5.0)
ALT: 10 U/L — ABNORMAL LOW (ref 14–54)
AST: 18 U/L (ref 15–41)
Alkaline Phosphatase: 36 U/L — ABNORMAL LOW (ref 38–126)
Anion gap: 7 (ref 5–15)
BUN: 14 mg/dL (ref 6–20)
CALCIUM: 9.2 mg/dL (ref 8.9–10.3)
CO2: 25 mmol/L (ref 22–32)
Chloride: 104 mmol/L (ref 101–111)
Creatinine, Ser: 0.47 mg/dL (ref 0.44–1.00)
GFR calc Af Amer: >60 mL/min (ref >60)
GFR calc non Af Amer: >60 mL/min (ref >60)
Glucose, Bld: 86 mg/dL (ref 65–99)
Potassium: 3.9 mmol/L (ref 3.5–5.1)
Sodium: 136 mmol/L (ref 135–145)
TOTAL PROTEIN: 7.3 g/dL (ref 6.5–8.1)
Total Bilirubin: <0.1 mg/dL — ABNORMAL LOW (ref 0.3–1.2)

## 2014-10-28 LAB — CBC
HCT: 32.5 % — ABNORMAL LOW (ref 35.0–47.0)
HEMOGLOBIN: 11.1 g/dL — AB (ref 12.0–16.0)
MCH: 31.5 pg (ref 26.0–34.0)
MCHC: 34.2 g/dL (ref 32.0–36.0)
MCV: 92.2 fL (ref 80.0–100.0)
PLATELETS: 198 10*3/uL (ref 150–440)
RBC: 3.52 MIL/uL — ABNORMAL LOW (ref 3.80–5.20)
RDW: 11.9 % (ref 11.5–14.5)
WBC: 8.3 10*3/uL (ref 3.6–11.0)

## 2014-10-28 LAB — HCG, QUANTITATIVE, PREGNANCY: hCG, Beta Chain, Quant, S: 112181 m[IU]/mL — ABNORMAL HIGH (ref <5)

## 2014-10-28 LAB — LIPASE, BLOOD: Lipase: 20 U/L — ABNORMAL LOW (ref 22–51)

## 2014-10-28 LAB — POCT PREGNANCY, URINE: Preg Test, Ur: POSITIVE — AB

## 2014-10-28 MED ORDER — METOCLOPRAMIDE HCL 10 MG PO TABS: 10.0000 mg | ORAL_TABLET | Freq: Three times a day (TID) | ORAL | Status: DC

## 2014-10-28 MED ORDER — METOCLOPRAMIDE HCL 10 MG PO TABS
10.0000 mg | ORAL_TABLET | Freq: Once | ORAL | Status: AC
  Administered 2014-10-28: 10 mg via ORAL
  Filled 2014-10-28: qty 1

## 2014-10-28 MED ORDER — SODIUM CHLORIDE 0.9 % IV BOLUS (SEPSIS)
1000.0000 mL | Freq: Once | INTRAVENOUS | Status: AC
  Administered 2014-10-28: 1000 mL via INTRAVENOUS

## 2014-10-28 MED ORDER — ACETAMINOPHEN 325 MG PO TABS
650.0000 mg | ORAL_TABLET | Freq: Once | ORAL | Status: AC
  Administered 2014-10-28: 650 mg via ORAL
  Filled 2014-10-28: qty 2

## 2014-10-28 NOTE — ED Provider Notes (Signed)
[facility] Emergency Department Provider Note  ____________________________________________  Time seen: Approximately 148 AM  I have reviewed the triage vital signs and the nursing notes.   HISTORY  Chief Complaint Nausea; Vomiting; and Back Pain    HPI Kelly Roberts is a 24 y.o. female who is [redacted] weeks pregnant and comes in with vomiting. She reports that she has been vomiting all day and is unable to keep anything down. The patient also reports that she has pain in her right side. She reports that she typically has morning sickness in the morning but today she has been unable to keep anything down. She reports that the pain in her right side is a 10 out of 10 in intensity. The patient reports that she has her first doctor's appointment next week and she has not yet had an ultrasound. The patient denies any bleeding. She is a G2 P1001. She also has some mild vaginal discharge but no burning with urination no smell no other concerns. The patient also denies any fever or chest pain or shortness of breath.   History reviewed. No pertinent past medical history.  There are no active problems to display for this patient.   History reviewed. No pertinent past surgical history.  Current Outpatient Rx  Name  Route  Sig  Dispense  Refill  . Prenatal Vit-Fe Fumarate-FA (EQL PRENATAL FORMULA) 28-0.8 MG TABS   Oral   Take 1 tablet by mouth daily.         . metoCLOPramide (REGLAN) 10 MG tablet   Oral   Take 1 tablet (10 mg total) by mouth 3 (three) times daily before meals.   15 tablet   0   . promethazine (PHENERGAN) 25 MG tablet   Oral   Take 1 tablet (25 mg total) by mouth every 6 (six) hours as needed for nausea or vomiting.   10 tablet   0     Allergies Review of patient's allergies indicates no known allergies.  No family history on file.  Social History History  Substance Use Topics  . Smoking status: Never Smoker   . Smokeless tobacco: Not  on file  . Alcohol Use: No    Review of Systems Constitutional: No fever/chills Eyes: No visual changes. ENT: No sore throat. Cardiovascular: Denies chest pain. Respiratory: Denies shortness of breath. Gastrointestinal: Right flank pain with nausea and vomiting Genitourinary: Negative for dysuria. Musculoskeletal:  back pain. Skin: Negative for rash. Neurological: Negative for headaches, focal weakness or numbness.  10-point ROS otherwise negative.  ____________________________________________   PHYSICAL EXAM:  VITAL SIGNS: ED Triage Vitals  Enc Vitals Group     BP 10/27/14 2349 100/67 mmHg     Pulse Rate 10/27/14 2349 81     Resp 10/27/14 2349 16     Temp 10/27/14 2349 98.3 F (36.8 C)     Temp Source 10/27/14 2349 Oral     SpO2 10/27/14 2349 98 %     Weight 10/27/14 2349 110 lb (49.896 kg)     Height 10/27/14 2349  (1.422 m)     Head Cir --      Peak Flow --      Pain Score 10/27/14 2350 10     Pain Loc --      Pain Edu? --      Excl. in GC? --     Constitutional: Alert and oriented. Well appearing and in moderate distress. Eyes: Conjunctivae are normal. PERRL. EOMI. Head: Atraumatic.  Nose: No congestion/rhinnorhea. Mouth/Throat: Mucous membranes are moist.  Oropharynx non-erythematous. Cardiovascular: Normal rate, regular rhythm. Grossly normal heart sounds.  Good peripheral circulation. Respiratory: Normal respiratory effort.  No retractions. Lungs CTAB. Gastrointestinal: Soft and nontender. No distention. Positive bowel sounds with right sided CVA tenderness to palpation Genitourinary: Deferred Musculoskeletal: No lower extremity tenderness nor edema.   Neurologic:  Normal speech and language.  Skin:  Skin is warm, dry and intact. No rash noted. Psychiatric: Mood and affect are normal.   ____________________________________________   LABS (all labs ordered are listed, but only abnormal results are displayed)  Labs Reviewed  URINALYSIS  COMPLETEWITH MICROSCOPIC ([facility] ONLY) - Abnormal; Notable for the following:    Color, Urine YELLOW (*)    APPearance CLEAR (*)    Leukocytes, UA TRACE (*)    Squamous Epithelial / LPF 0-5 (*)    All other components within normal limits  LIPASE, BLOOD - Abnormal; Notable for the following:    Lipase 20 (*)    All other components within normal limits  COMPREHENSIVE METABOLIC PANEL - Abnormal; Notable for the following:    ALT 10 (*)    Alkaline Phosphatase 36 (*)    Total Bilirubin <0.1 (*)    All other components within normal limits  CBC - Abnormal; Notable for the following:    RBC 3.52 (*)    Hemoglobin 11.1 (*)    HCT 32.5 (*)    All other components within normal limits  HCG, QUANTITATIVE, PREGNANCY - Abnormal; Notable for the following:    hCG, Beta Chain, Quant, S [id number] (*)    All other components within normal limits  POCT PREGNANCY, URINE - Abnormal; Notable for the following:    Preg Test, Ur POSITIVE (*)    All other components within normal limits  POC URINE PREG, ED   ____________________________________________  EKG  None ____________________________________________  RADIOLOGY  Renal ultrasound: Unremarkable renal ultrasound Pelvic ultrasound: Single live intrauterine pregnancy with crown-rump length of 2.1 cm corresponding to a gestational age of [redacted] weeks 5 days. ____________________________________________   PROCEDURES  Procedure(s) performed: None  Critical Care performed: No  ____________________________________________   INITIAL IMPRESSION / ASSESSMENT AND PLAN / ED COURSE  Pertinent labs & imaging results that were available during my care of the patient were reviewed by me and considered in my medical decision making (see chart for details).  This is a 24 year old female who comes in with increased nausea and vomiting as well as some right flank pain. The patient's renal ultrasound and urinalysis is unremarkable. Also her pelvic ultrasound  is also unremarkable. I did give the patient liter of normal saline I will also give her some Tylenol as well as tramadol and ensure that she is able take keep down some fluids. The patient be discharged home to follow up with OB/GYN. ____________________________________________   FINAL CLINICAL IMPRESSION(S) / ED DIAGNOSES  Final diagnoses:  Hyperemesis gravidarum  Flank pain      Rebecka Apley, MD 10/28/14 [phone]

## 2014-10-28 NOTE — Discharge Instructions (Signed)
Flank Pain Flank pain refers to pain that is located on the side of the body between the upper abdomen and the back. The pain may occur over a short period of time (acute) or may be long-term or reoccurring (chronic). It may be mild or severe. Flank pain can be caused by many things. CAUSES  Some of the more common causes of flank pain include:  Muscle strains.   Muscle spasms.   A disease of your spine (vertebral disk disease).   A lung infection (pneumonia).   Fluid around your lungs (pulmonary edema).   A kidney infection.   Kidney stones.   A very painful skin rash caused by the chickenpox virus (shingles).   Gallbladder disease.  HOME CARE INSTRUCTIONS  Home care will depend on the cause of your pain. In general,  Rest as directed by your caregiver.  Drink enough fluids to keep your urine clear or pale yellow.  Only take over-the-counter or prescription medicines as directed by your caregiver. Some medicines may help relieve the pain.  Tell your caregiver about any changes in your pain.  Follow up with your caregiver as directed. SEEK IMMEDIATE MEDICAL CARE IF:   Your pain is not controlled with medicine.   You have new or worsening symptoms.  Your pain increases.   You have abdominal pain.   You have shortness of breath.   You have persistent nausea or vomiting.   You have swelling in your abdomen.   You feel faint or pass out.   You have blood in your urine.  You have a fever or persistent symptoms for more than 2-3 days.  You have a fever and your symptoms suddenly get worse. MAKE SURE YOU:   Understand these instructions.  Will watch your condition.  Will get help right away if you are not doing well or get worse. Document Released: 04/28/2005 Document Revised: 11/30/2011 Document Reviewed: 10/20/2011 [facility] Patient Information 2015 [place], [place]. This information is not intended to replace advice given to you by your  health care provider. Make sure you discuss any questions you have with your health care provider.  Hyperemesis Gravidarum Hyperemesis gravidarum is a severe form of nausea and vomiting that happens during pregnancy. Hyperemesis is worse than morning sickness. It may cause you to have nausea or vomiting all day for many days. It may keep you from eating and drinking enough food and liquids. Hyperemesis usually occurs during the first half (the first 20 weeks) of pregnancy. It often goes away once a woman is in her second half of pregnancy. However, sometimes hyperemesis continues through an entire pregnancy.  CAUSES  The cause of this condition is not completely known but is thought to be related to changes in the body's hormones when pregnant. It could be from the high level of the pregnancy hormone or an increase in estrogen in the body.  SIGNS AND SYMPTOMS   Severe nausea and vomiting.  Nausea that does not go away.  Vomiting that does not allow you to keep any food down.  Weight loss and body fluid loss (dehydration).  Having no desire to eat or not liking food you have previously enjoyed. DIAGNOSIS  Your health care provider will do a physical exam and ask you about your symptoms. He or she may also order blood tests and urine tests to make sure something else is not causing the problem.  TREATMENT  You may only need medicine to control the problem. If medicines do not control  the nausea and vomiting, you will be treated in the hospital to prevent dehydration, increased acid in the blood (acidosis), weight loss, and changes in the electrolytes in your body that may harm the unborn baby (fetus). You may need IV fluids.  HOME CARE INSTRUCTIONS   Only take over-the-counter or prescription medicines as directed by your health care provider.  Try eating a couple of dry crackers or toast in the morning before getting out of bed.  Avoid foods and smells that upset your stomach.  Avoid  fatty and spicy foods.  Eat 5-6 small meals a day.  Do not drink when eating meals. Drink between meals.  For snacks, eat high-protein foods, such as cheese.  Eat or suck on things that have ginger in them. Ginger helps nausea.  Avoid food preparation. The smell of food can spoil your appetite.  Avoid iron pills and iron in your multivitamins until after 3-4 months of being pregnant. However, consult with your health care provider before stopping any prescribed iron pills. SEEK MEDICAL CARE IF:   Your abdominal pain increases.  You have a severe headache.  You have vision problems.  You are losing weight. SEEK IMMEDIATE MEDICAL CARE IF:   You are unable to keep fluids down.  You vomit blood.  You have constant nausea and vomiting.  You have excessive weakness.  You have extreme thirst.  You have dizziness or fainting.  You have a fever or persistent symptoms for more than 2-3 days.  You have a fever and your symptoms suddenly get worse. MAKE SURE YOU:   Understand these instructions.  Will watch your condition.  Will get help right away if you are not doing well or get worse. Document Released: 03/07/2005 Document Revised: 12/26/2012 Document Reviewed: 10/17/2012 [facility] Patient Information 2015 [place], [place]. This information is not intended to replace advice given to you by your health care provider. Make sure you discuss any questions you have with your health care provider.

## 2014-11-07 ENCOUNTER — Other Ambulatory Visit: Payer: Self-pay | Admitting: Obstetrics and Gynecology

## 2014-11-07 DIAGNOSIS — Z369 Encounter for antenatal screening, unspecified: Secondary | ICD-10-CM

## 2014-11-10 DIAGNOSIS — O09899 Supervision of other high risk pregnancies, unspecified trimester: Secondary | ICD-10-CM

## 2014-11-20 ENCOUNTER — Ambulatory Visit
Admission: RE | Admit: 2014-11-20 | Discharge: 2014-11-20 | Disposition: A | Discharge status: Home / Self Care | Payer: BLUE CROSS/BLUE SHIELD | Source: Ambulatory Visit | Attending: Obstetrics and Gynecology | Admitting: Obstetrics and Gynecology

## 2014-11-20 ENCOUNTER — Ambulatory Visit
Admission: RE | Admit: 2014-11-20 | Discharge: 2014-11-20 | Disposition: A | Discharge status: Home / Self Care | Payer: BLUE CROSS/BLUE SHIELD | Source: Ambulatory Visit | Attending: Obstetrics & Gynecology | Admitting: Obstetrics & Gynecology

## 2014-11-20 DIAGNOSIS — Z3A12 12 weeks gestation of pregnancy: Secondary | ICD-10-CM | POA: Diagnosis not present

## 2014-11-20 DIAGNOSIS — Z369 Encounter for antenatal screening, unspecified: Secondary | ICD-10-CM

## 2014-11-20 DIAGNOSIS — O99331 Smoking (tobacco) complicating pregnancy, first trimester: Secondary | ICD-10-CM | POA: Insufficient documentation

## 2014-11-20 DIAGNOSIS — Z36 Encounter for antenatal screening of mother: Secondary | ICD-10-CM | POA: Insufficient documentation

## 2014-11-20 HISTORY — DX: Other specified health status: Z78.9

## 2014-11-20 NOTE — Progress Notes (Addendum)
Referring physician:  [facility] Length of Consultation: 30 minutes   Kelly Roberts  was referred to [facility] for genetic counseling to review prenatal screening and testing options.  This note summarizes the information we discussed.    First trimester screening, is a screening test that includes nuchal translucency ultrasound screen and first trimester maternal serum marker screening.  The nuchal translucency has approximately an 80% detection rate for Down syndrome and can be positive for other chromosome abnormalities as well as congenital heart defects.  When combined with a maternal serum marker screening, the detection rate is up to 90% for Down syndrome and up to 97% for trisomy 18.     Maternal serum marker screening, a blood test that measures pregnancy proteins, can provide risk assessments for Down syndrome, trisomy 18, and open neural tube defects (spina bifida, anencephaly). Because it does not directly examine the fetus, it cannot positively diagnose or rule out these problems.  Targeted ultrasound uses high frequency sound waves to create an image of the developing fetus.  An ultrasound is often recommended as a routine means of evaluating the pregnancy.  It is also used to screen for fetal anatomy problems (for example, a heart defect) that might be suggestive of a chromosomal or other abnormality.   Should these screening tests indicate an increased concern, then the following diagnostic options would be offered:  The chorionic villus sampling procedure is available for first trimester chromosome analysis.  This involves the withdrawal of a small amount of chorionic villi (tissue from the developing placenta).  Risk of pregnancy loss is estimated to be approximately 1 in 200 to 1 in 100 (0.5 to 1%).  There is approximately a 1% (1 in 100) chance that the CVS chromosome results will be unclear.  Chorionic villi cannot be tested for neural tube defects.      Amniocentesis involves the removal of a small amount of amniotic fluid from the sac surrounding the fetus with the use of a thin needle inserted through the maternal abdomen and uterus.  Ultrasound guidance is used throughout the procedure.  Fetal cells from amniotic fluid are directly evaluated and > 99.5% of chromosome problems and > 98% of open neural tube defects can be detected. This procedure is generally performed after the 15th week of pregnancy.  The main risks to this procedure include complications leading to miscarriage in less than 1 in 200 cases (0.5%).  As another option for information if the pregnancy is suspected to be an an increased chance for certain chromosome conditions, we also reviewed the availability of cell free fetal DNA testing from maternal blood to determine whether or not the baby may have either Down syndrome, 23, trisomy 13, or trisomy 93.  This test utilizes a maternal blood sample and DNA sequencing technology to isolate circulating cell free fetal DNA from maternal plasma.  The fetal DNA can then be analyzed for DNA sequences that are derived from the three most common chromosomes involved in aneuploidy, chromosomes 13, 18, and 21.  If the overall amount of DNA is greater than the expected level for any of these chromosomes, aneuploidy is suspected.  While we do not consider it a replacement for invasive testing and karyotype analysis, a negative result from this testing would be reassuring, though not a guarantee of a normal chromosome complement for the baby.  An abnormal result is certainly suggestive of an abnormal chromosome complement, though we would still recommend CVS or amniocentesis to  confirm any findings from this testing.  We obtained a detailed family history and pregnancy history.  This is the second pregnancy for Kelly Roberts, the first with her current partner.  She has a 31 year old daughter who is in good health.  In the family history, Kelly Roberts reported a  paternal half brother with finger abnormalities, described as small nubs on one hand.  He is otherwise in good health with normal development.  We discussed that there may be various reasons for limb anomalies including amniotic bands, vascular disruption and genetic syndromes.  In the absence of other birth differences or developmental differences it is unlikely to be the result of a genetic syndrome that would significantly increase the risk for a similar condition in this pregnancy. The remainder of the family history was reported to be unremarkable for birth defects, mental retardation, recurrent pregnancy loss or known chromosome abnormalities.  Kelly Roberts reported no complications or exposures in this pregnancy that are expected to increase the risk for birth defects.  She stated that she has stopped smoking since she learned that she was pregnant.   After consideration of the options, Kelly Roberts elected to proceed with first trimester screening.  An ultrasound was performed at the time of the visit.  Fetal anatomy could not be assessed due to early gestational age.  Please refer to the ultrasound report for details of that study.  Kelly Roberts was encouraged to call with questions or concerns.  We can be contacted at [phone].   Cherly Anderson, MS, CGC   I was present Keren Alverio, [place] A, MD

## 2014-11-27 ENCOUNTER — Telehealth: Payer: Self-pay | Admitting: Obstetrics and Gynecology

## 2014-11-27 NOTE — Telephone Encounter (Signed)
   Kelly Roberts elected to undergo First Trimester screening as a part of her routine prenatal care on 11/20/14.  To review, first trimester screening, includes nuchal translucency ultrasound screen and/or first trimester maternal serum marker screening.  The nuchal translucency has approximately an 80% detection rate for Down syndrome and can be positive for other chromosome abnormalities as well as heart defects.  When combined with a maternal serum marker screening, the detection rate is up to 90% for Down syndrome and up to 97% for trisomy 13 and 18.     The results of the First Trimester Nuchal Translucency and Biochemical Screening were within normal range for aneuploidy.  The risk for Down syndrome is now estimated to be 1 in >10,000.  The risk for Trisomy 13/18 is 1 in 861.  Should more definitive information be desired, we would offer amniocentesis.  Because we do not yet know the effectiveness of combined first and second trimester screening, we do not recommend a maternal serum screen to assess the chance for chromosome conditions.  However, if screening for neural tube defects is desired, maternal serum screening for AFP only can be performed between 15 and [redacted] weeks gestation.    Of note, the PAPP-A level was at the 0.1 percentile.  PAPP-A results at or below the 5th percentile have been associated with and increased chance for adverse obstetrical outcomes, including preeclampsia.  For this reason, a third trimester ultrasound for growth should be considered.

## 2014-12-24 ENCOUNTER — Encounter: Payer: Self-pay | Admitting: Emergency Medicine

## 2014-12-24 ENCOUNTER — Emergency Department: Payer: Medicaid Other

## 2014-12-24 ENCOUNTER — Emergency Department
Admission: EM | Admit: 2014-12-24 | Discharge: 2014-12-25 | Disposition: A | Discharge status: Home / Self Care | Payer: Medicaid Other | Attending: Emergency Medicine | Admitting: Emergency Medicine

## 2014-12-24 ENCOUNTER — Emergency Department (HOSPITAL_BASED_OUTPATIENT_CLINIC_OR_DEPARTMENT_OTHER)
Admit: 2014-12-24 | Discharge: 2014-12-24 | Disposition: A | Discharge status: Home / Self Care | Payer: Medicaid Other | Attending: Emergency Medicine | Admitting: Emergency Medicine

## 2014-12-24 DIAGNOSIS — R0600 Dyspnea, unspecified: Secondary | ICD-10-CM

## 2014-12-24 DIAGNOSIS — Z3A17 17 weeks gestation of pregnancy: Secondary | ICD-10-CM | POA: Insufficient documentation

## 2014-12-24 DIAGNOSIS — R109 Unspecified abdominal pain: Secondary | ICD-10-CM | POA: Insufficient documentation

## 2014-12-24 DIAGNOSIS — Z87891 Personal history of nicotine dependence: Secondary | ICD-10-CM | POA: Diagnosis not present

## 2014-12-24 DIAGNOSIS — R0602 Shortness of breath: Secondary | ICD-10-CM | POA: Diagnosis not present

## 2014-12-24 DIAGNOSIS — Z79899 Other long term (current) drug therapy: Secondary | ICD-10-CM | POA: Insufficient documentation

## 2014-12-24 DIAGNOSIS — O9989 Other specified diseases and conditions complicating pregnancy, childbirth and the puerperium: Secondary | ICD-10-CM | POA: Insufficient documentation

## 2014-12-24 LAB — HEPATIC FUNCTION PANEL
ALBUMIN: 3.5 g/dL (ref 3.5–5.0)
ALK PHOS: 37 U/L — AB (ref 38–126)
ALT: 22 U/L (ref 14–54)
AST: 39 U/L (ref 15–41)
Bilirubin, Direct: <0.1 mg/dL — ABNORMAL LOW (ref 0.1–0.5)
TOTAL PROTEIN: 6.8 g/dL (ref 6.5–8.1)

## 2014-12-24 LAB — CBC WITH DIFFERENTIAL/PLATELET
Basophils Absolute: 0 10*3/uL (ref 0–0.1)
Basophils Relative: 0 %
EOS PCT: 2 %
Eosinophils Absolute: 0.2 10*3/uL (ref 0–0.7)
HCT: 31 % — ABNORMAL LOW (ref 35.0–47.0)
Hemoglobin: 10.6 g/dL — ABNORMAL LOW (ref 12.0–16.0)
LYMPHS ABS: 1.5 10*3/uL (ref 1.0–3.6)
LYMPHS PCT: 15 %
MCH: 31.3 pg (ref 26.0–34.0)
MCHC: 34.1 g/dL (ref 32.0–36.0)
MCV: 91.7 fL (ref 80.0–100.0)
MONOS PCT: 7 %
Monocytes Absolute: 0.7 10*3/uL (ref 0.2–0.9)
Neutro Abs: 7.6 10*3/uL — ABNORMAL HIGH (ref 1.4–6.5)
Neutrophils Relative %: 76 %
Platelets: 172 10*3/uL (ref 150–440)
RBC: 3.38 MIL/uL — AB (ref 3.80–5.20)
RDW: 12.8 % (ref 11.5–14.5)
WBC: 10.1 10*3/uL (ref 3.6–11.0)

## 2014-12-24 LAB — CBC
HCT: 31 % — ABNORMAL LOW (ref 35.0–47.0)
HEMOGLOBIN: 10.6 g/dL — AB (ref 12.0–16.0)
MCH: 31.5 pg (ref 26.0–34.0)
MCHC: 34.1 g/dL (ref 32.0–36.0)
MCV: 92.4 fL (ref 80.0–100.0)
PLATELETS: 169 10*3/uL (ref 150–440)
RBC: 3.36 MIL/uL — AB (ref 3.80–5.20)
RDW: 12.5 % (ref 11.5–14.5)
WBC: 9.7 10*3/uL (ref 3.6–11.0)

## 2014-12-24 LAB — MAGNESIUM: MAGNESIUM: 1.8 mg/dL (ref 1.7–2.4)

## 2014-12-24 LAB — BASIC METABOLIC PANEL
Anion gap: 8 (ref 5–15)
BUN: 9 mg/dL (ref 6–20)
CHLORIDE: 102 mmol/L (ref 101–111)
CO2: 19 mmol/L — ABNORMAL LOW (ref 22–32)
CREATININE: 0.38 mg/dL — AB (ref 0.44–1.00)
Calcium: 8.6 mg/dL — ABNORMAL LOW (ref 8.9–10.3)
GFR calc non Af Amer: >60 mL/min (ref >60)
Glucose, Bld: 119 mg/dL — ABNORMAL HIGH (ref 65–99)
Potassium: 3.1 mmol/L — ABNORMAL LOW (ref 3.5–5.1)
Sodium: 129 mmol/L — ABNORMAL LOW (ref 135–145)

## 2014-12-24 LAB — URINALYSIS COMPLETE WITH MICROSCOPIC (ARMC ONLY)
BACTERIA UA: NONE SEEN
Bilirubin Urine: NEGATIVE
Glucose, UA: 50 mg/dL — AB
Hgb urine dipstick: NEGATIVE
Ketones, ur: NEGATIVE mg/dL
Leukocytes, UA: NEGATIVE
Nitrite: NEGATIVE
PH: 6 (ref 5.0–8.0)
PROTEIN: NEGATIVE mg/dL
RBC / HPF: NONE SEEN RBC/hpf (ref 0–5)
Specific Gravity, Urine: 1.016 (ref 1.005–1.030)

## 2014-12-24 LAB — BRAIN NATRIURETIC PEPTIDE: B Natriuretic Peptide: 24 pg/mL (ref 0.0–100.0)

## 2014-12-24 LAB — FIBRIN DERIVATIVES D-DIMER (ARMC ONLY): Fibrin derivatives D-dimer (ARMC): 358 (ref 0–499)

## 2014-12-24 LAB — TROPONIN I: Troponin I: <0.03 ng/mL (ref <0.031)

## 2014-12-24 MED ORDER — POTASSIUM CHLORIDE 20 MEQ/15ML (10%) PO SOLN
40.0000 meq | Freq: Once | ORAL | Status: AC
  Administered 2014-12-24: 40 meq via ORAL
  Filled 2014-12-24 (×2): qty 30

## 2014-12-24 MED ORDER — SODIUM CHLORIDE 0.9 % IV SOLN
Freq: Once | INTRAVENOUS | Status: AC
  Administered 2014-12-24: 21:00:00 via INTRAVENOUS

## 2014-12-24 NOTE — Discharge Instructions (Signed)
Please follow-[facility] with [facility] tomorrow. If she gets extremely short of breath tonight calling ambulance and come back again. So far all the tests that I have done have been completely normal.

## 2014-12-24 NOTE — ED Notes (Signed)
Pt to xray via stretcher with xray tech

## 2014-12-24 NOTE — ED Notes (Signed)
Called & spoke with Morrie Sheldon in lab to add-on magnesium level

## 2014-12-24 NOTE — ED Provider Notes (Addendum)
[facility] Emergency Department Provider Note  ____________________________________________  Time seen: Approximately 4:28 PM  I have reviewed the triage vital signs and the nursing notes.   HISTORY  Chief Complaint Shortness of Breath    HPI Kelly Roberts is a 24 y.o. female who reports 1 week of progressive shortness of breath. Shortness breath is getting worse day by day last night it was so bad she couldn't lay down flat this morning she got short of breath and she got up and started to move around. This is the patient's second pregnancy she did not have any of this kind of problem with the first pregnancy. Patient is [redacted] weeks pregnant present time. Patient denies any cough any pleuritic or any other kind of chest pain there is not any fever. Patient just says she can't quite get her breath.   Past Medical History  Diagnosis Date  . Medical history non-contributory     Patient Active Problem List   Diagnosis Date Noted  . First trimester screening 11/20/2014    Past Surgical History  Procedure Laterality Date  . No past surgeries      Current Outpatient Rx  Name  Route  Sig  Dispense  Refill  . metoCLOPramide (REGLAN) 10 MG tablet   Oral   Take 1 tablet (10 mg total) by mouth 3 (three) times daily before meals. Patient not taking: Reported on 11/20/2014   15 tablet   0   . Prenatal Vit-Fe Fumarate-FA (EQL PRENATAL FORMULA) 28-0.8 MG TABS   Oral   Take 1 tablet by mouth daily.         . promethazine (PHENERGAN) 25 MG tablet   Oral   Take 1 tablet (25 mg total) by mouth every 6 (six) hours as needed for nausea or vomiting.   10 tablet   0     Allergies Review of patient's allergies indicates no known allergies.  History reviewed. No pertinent family history.  Social History Social History  Substance Use Topics  . Smoking status: Former Smoker    Types: Cigarettes  . Smokeless tobacco: Never Used  . Alcohol Use: No     Review of Systems Constitutional: No fever/chills Eyes: No visual changes. ENT: No sore throat. Cardiovascular: Denies chest pain. Respiratory: See history of present illness. Gastrointestinal: Patient has had crampy abdominal pain intermittently with this pregnancy now with the previous pregnancy patient has had a normal ultrasound at 8 weeks  No nausea, no vomiting.  No diarrhea.  No constipation. Genitourinary: Negative for dysuria. Musculoskeletal: Negative for back pain. Skin: Negative for rash. Neurological: Negative for headaches, focal weakness or numbness.  10-point ROS otherwise negative.  ____________________________________________   PHYSICAL EXAM:  VITAL SIGNS: ED Triage Vitals  Enc Vitals Group     BP 12/24/14 1341 112/58 mmHg     Pulse Rate 12/24/14 1341 89     Resp 12/24/14 1341 20     Temp 12/24/14 1341 98.6 F (37 C)     Temp Source 12/24/14 1341 Oral     SpO2 12/24/14 1341 97 %     Weight 12/24/14 1341 114 lb (51.71 kg)     Height 12/24/14 1341  (1.422 m)     Head Cir --      Peak Flow --      Pain Score 12/24/14 1341 0     Pain Loc --      Pain Edu? --      Excl. in GC? --  Constitutional: Alert and oriented. Well appearing and in no acute distress. Eyes: Conjunctivae are normal. PERRL. EOMI. Head: Atraumatic. Nose: No congestion/rhinnorhea. Mouth/Throat: Mucous membranes are moist.  Oropharynx non-erythematous. Neck: No stridor.  No jugular venous distention Cardiovascular: Normal rate, regular rhythm. Grossly normal heart sounds.  Good peripheral circulation. Respiratory: Normal respiratory effort.  No retractions. Lungs CTAB. Gastrointestinal: Soft and nontender. No distention. No abdominal bruits. No CVA tenderness. Musculoskeletal: No lower extremity tenderness nor edema.  No joint effusions. Neurologic:  Normal speech and language. No gross focal neurologic deficits are appreciated. No gait instability. Skin:  Skin is warm, dry  and intact. No rash noted. Psychiatric: Mood and affect are normal. Speech and behavior are normal.  ____________________________________________   LABS (all labs ordered are listed, but only abnormal results are displayed)  Labs Reviewed  BASIC METABOLIC PANEL - Abnormal; Notable for the following:    Sodium 129 (*)    Potassium 3.1 (*)    CO2 19 (*)    Glucose, Bld 119 (*)    Creatinine, Ser 0.38 (*)    Calcium 8.6 (*)    All other components within normal limits  CBC - Abnormal; Notable for the following:    RBC 3.36 (*)    Hemoglobin 10.6 (*)    HCT 31.0 (*)    All other components within normal limits  TROPONIN I  BRAIN NATRIURETIC PEPTIDE  HEPATIC FUNCTION PANEL  CBC WITH DIFFERENTIAL/PLATELET  FIBRIN DERIVATIVES D-DIMER ([facility] ONLY)   ____________________________________________  EKG  EKG read and interpreted by me shows normal sinus rhythm at a rate of 84 normal axis nonspecific ST-T wave changes however these are similar to an EKG done in June of this year. ____________________________________________  RADIOLOGY  Echocardiogram read as normal ____________________________________________   PROCEDURES   ____________________________________________   INITIAL IMPRESSION / ASSESSMENT AND PLAN / ED COURSE  Pertinent labs & imaging results that were available during my care of the patient were reviewed by me and considered in my medical decision making (see chart for details).  Discussed with Dr. Feliberto Gottron OB/GYN. [facility] and Dr. Herbie [place] cardiologist. We will try to get a cardiac echo to check on the patient's heart. After d-dimer and ENT and troponin and echocardiogram and chest x-ray come back is normal I discussed with the OB again they will follow-up tomorrow patient laid down in bed flat with the nurse present patient reported discomfort breathing however O2 sats stayed at 100% patient respiratory rate did not change I gave her by mouth potassium  and some normal saline IV patient tolerated those both very well I will have her follow-up tomorrow with OB/GYN for further evaluation of the sodium potassium and bicarbonate are low. ____________________________________________   FINAL CLINICAL IMPRESSION(S) / ED DIAGNOSES  Final diagnoses:  None    Medical screening examination/treatment/procedure(s) were performed by non-physician practitioner and as supervising physician I was immediately available for consultation/collaboration.    Arnaldo Natal, MD 12/25/14 0119  Diagnosis is dyspnea  Arnaldo Natal, MD 12/25/14 [phone]

## 2014-12-24 NOTE — ED Notes (Signed)
Pt given sandwich tray and sprite.  

## 2014-12-24 NOTE — ED Notes (Signed)
Pt to ed with c/o sob x 1 week, much worse last night.  Pt states sob is worse with laying down and last night she was unable to sleep.  Pt denies complications during this pregnancy.  Pt is approx 17 weeks ob at [facility].  G2P1

## 2014-12-24 NOTE — ED Notes (Signed)
Pt tolerated 100% tray; denies c/o at present; pt laid completely flat to assess; reports increased SOB when lying flat and at times with exertion but with no accomp symptoms; sats remain 100% on ra with HR 78 and no fluctuations; Dr Darnelle Catalan notified

## 2014-12-26 LAB — URINE CULTURE: Special Requests: NORMAL

## 2015-02-24 ENCOUNTER — Encounter: Payer: Self-pay | Admitting: *Deleted

## 2015-02-24 ENCOUNTER — Observation Stay
Admission: EM | Admit: 2015-02-24 | Discharge: 2015-02-24 | Disposition: A | Discharge status: Home / Self Care | Payer: BLUE CROSS/BLUE SHIELD | Attending: Obstetrics and Gynecology | Admitting: Obstetrics and Gynecology

## 2015-02-24 DIAGNOSIS — O26892 Other specified pregnancy related conditions, second trimester: Principal | ICD-10-CM | POA: Insufficient documentation

## 2015-02-24 DIAGNOSIS — Z3A26 26 weeks gestation of pregnancy: Secondary | ICD-10-CM | POA: Diagnosis not present

## 2015-02-24 DIAGNOSIS — K5900 Constipation, unspecified: Secondary | ICD-10-CM | POA: Insufficient documentation

## 2015-02-24 DIAGNOSIS — O9989 Other specified diseases and conditions complicating pregnancy, childbirth and the puerperium: Secondary | ICD-10-CM

## 2015-02-24 DIAGNOSIS — O99891 Other specified diseases and conditions complicating pregnancy: Secondary | ICD-10-CM | POA: Diagnosis present

## 2015-02-24 DIAGNOSIS — M549 Dorsalgia, unspecified: Secondary | ICD-10-CM | POA: Diagnosis present

## 2015-02-24 DIAGNOSIS — M545 Low back pain: Secondary | ICD-10-CM | POA: Diagnosis present

## 2015-02-24 LAB — URINALYSIS COMPLETE WITH MICROSCOPIC (ARMC ONLY)
BACTERIA UA: NONE SEEN
Bilirubin Urine: NEGATIVE
Glucose, UA: NEGATIVE mg/dL
Hgb urine dipstick: NEGATIVE
Leukocytes, UA: NEGATIVE
Nitrite: NEGATIVE
PH: 6 (ref 5.0–8.0)
PROTEIN: NEGATIVE mg/dL
Specific Gravity, Urine: 1.021 (ref 1.005–1.030)

## 2015-02-24 NOTE — Progress Notes (Signed)
Patient ID: Kelly Roberts, female   DOB: June 14, 1990, 24 y.o.   MRN: [id number] Kelly Luiserri B Corker June 14, 1990 G2 P1 at 26 weeks c/o 1 day h/o low back pain .   noLOF , no vaginal bleeding ,no trauma , no intercourse  O;BP 102/53 mmHg  Pulse 103  Temp(Src) 98.6 F (37 C) (Oral)  Resp 18  LMP 08/23/2014 ABDsoft NT , no rebound CX long and closed , ++ hard stool palpated through R/V septum  NST fetal HR 130, no decels , no ctx  Labs: ua . Pending  A: constipation  P:Miralax bid , colace 100 mg BID prn , metamucil  UA pending  RTC if worse

## 2015-03-05 ENCOUNTER — Encounter: Payer: Self-pay | Admitting: *Deleted

## 2015-03-05 ENCOUNTER — Observation Stay
Admission: EM | Admit: 2015-03-05 | Discharge: 2015-03-05 | Disposition: A | Discharge status: Home / Self Care | Payer: BLUE CROSS/BLUE SHIELD | Attending: Obstetrics and Gynecology | Admitting: Obstetrics and Gynecology

## 2015-03-05 DIAGNOSIS — R112 Nausea with vomiting, unspecified: Secondary | ICD-10-CM | POA: Diagnosis present

## 2015-03-05 DIAGNOSIS — Z3A27 27 weeks gestation of pregnancy: Secondary | ICD-10-CM | POA: Diagnosis not present

## 2015-03-05 DIAGNOSIS — Z3490 Encounter for supervision of normal pregnancy, unspecified, unspecified trimester: Secondary | ICD-10-CM

## 2015-03-05 DIAGNOSIS — O26892 Other specified pregnancy related conditions, second trimester: Principal | ICD-10-CM | POA: Insufficient documentation

## 2015-03-05 LAB — COMPREHENSIVE METABOLIC PANEL
ALBUMIN: 3.2 g/dL — AB (ref 3.5–5.0)
ALK PHOS: 50 U/L (ref 38–126)
ALT: 9 U/L — ABNORMAL LOW (ref 14–54)
ANION GAP: 8 (ref 5–15)
AST: 18 U/L (ref 15–41)
BUN: 8 mg/dL (ref 6–20)
CALCIUM: 8.6 mg/dL — AB (ref 8.9–10.3)
CHLORIDE: 105 mmol/L (ref 101–111)
CO2: 21 mmol/L — AB (ref 22–32)
Creatinine, Ser: 0.32 mg/dL — ABNORMAL LOW (ref 0.44–1.00)
GFR calc non Af Amer: >60 mL/min (ref >60)
Glucose, Bld: 72 mg/dL (ref 65–99)
POTASSIUM: 3.7 mmol/L (ref 3.5–5.1)
SODIUM: 134 mmol/L — AB (ref 135–145)
Total Bilirubin: 0.4 mg/dL (ref 0.3–1.2)
Total Protein: 6.3 g/dL — ABNORMAL LOW (ref 6.5–8.1)

## 2015-03-05 LAB — CBC WITH DIFFERENTIAL/PLATELET
BASOS PCT: 0 %
Basophils Absolute: 0 10*3/uL (ref 0–0.1)
EOS ABS: 0.1 10*3/uL (ref 0–0.7)
EOS PCT: 1 %
HCT: 31.5 % — ABNORMAL LOW (ref 35.0–47.0)
Hemoglobin: 10.6 g/dL — ABNORMAL LOW (ref 12.0–16.0)
LYMPHS ABS: 1.6 10*3/uL (ref 1.0–3.6)
Lymphocytes Relative: 15 %
MCH: 30.7 pg (ref 26.0–34.0)
MCHC: 33.7 g/dL (ref 32.0–36.0)
MCV: 91.1 fL (ref 80.0–100.0)
MONO ABS: 0.7 10*3/uL (ref 0.2–0.9)
MONOS PCT: 7 %
Neutro Abs: 8.1 10*3/uL — ABNORMAL HIGH (ref 1.4–6.5)
Neutrophils Relative %: 77 %
PLATELETS: 147 10*3/uL — AB (ref 150–440)
RBC: 3.46 MIL/uL — ABNORMAL LOW (ref 3.80–5.20)
RDW: 12.6 % (ref 11.5–14.5)
WBC: 10.4 10*3/uL (ref 3.6–11.0)

## 2015-03-05 LAB — URINALYSIS COMPLETE WITH MICROSCOPIC (ARMC ONLY)
BACTERIA UA: NONE SEEN
Bilirubin Urine: NEGATIVE
GLUCOSE, UA: NEGATIVE mg/dL
HGB URINE DIPSTICK: NEGATIVE
Ketones, ur: NEGATIVE mg/dL
LEUKOCYTES UA: NEGATIVE
NITRITE: NEGATIVE
PH: 6 (ref 5.0–8.0)
PROTEIN: NEGATIVE mg/dL
SPECIFIC GRAVITY, URINE: 1.021 (ref 1.005–1.030)

## 2015-03-05 MED ORDER — ONDANSETRON HCL 4 MG/2ML IJ SOLN: 4.0000 mg | Freq: Four times a day (QID) | INTRAMUSCULAR | Status: DC | PRN

## 2015-03-05 MED ORDER — LACTATED RINGERS IV BOLUS (SEPSIS): 1000.0000 mL | Freq: Once | INTRAVENOUS | Status: DC

## 2015-03-05 MED ORDER — ONDANSETRON HCL 4 MG/2ML IJ SOLN: 4.0000 mg | Freq: Three times a day (TID) | INTRAMUSCULAR | Status: DC | PRN

## 2015-03-05 MED ORDER — DEXTROSE IN LACTATED RINGERS 5 % IV SOLN
INTRAVENOUS | Status: DC
  Administered 2015-03-05: 500 mL/h via INTRAVENOUS

## 2015-03-05 MED ORDER — DEXTROSE IN LACTATED RINGERS 5 % IV SOLN: INTRAVENOUS | Status: DC

## 2015-03-05 NOTE — OB Triage Provider Note (Signed)
History     CSN: [id number]  Arrival date and time: 03/05/15 1124   None     No chief complaint on file.  HPI  Kelly Roberts is a 24 yo G2P1 at 27+5 weeks by LMP of 08/23/14 with c/o of nausea and vomiting since last night at 8 pm.  She was seen at the [facility] office and has been unable to keep down food and liquids since last night.  She has tried phenergan and diclegis at home without relief. She is here to receive IV hydration. OB History    Gravida Para Term Preterm AB TAB SAB Ectopic Multiple Living   Past Medical History  Diagnosis Date  . Medical history non-contributory     Past Surgical History  Procedure Laterality Date  . No past surgeries      No family history on file.  Social History  Substance Use Topics  . Smoking status: Former Smoker    Types: Cigarettes  . Smokeless tobacco: Never Used  . Alcohol Use: No    Allergies: No Known Allergies  Prescriptions prior to admission  Medication Sig Dispense Refill Last Dose  . Doxylamine-Pyridoxine (DICLEGIS) 10-10 MG TBEC Take 2 tablets by mouth at bedtime.   02/23/2015 at Unknown time  . metoCLOPramide (REGLAN) 10 MG tablet Take 1 tablet (10 mg total) by mouth 3 (three) times daily before meals. (Patient not taking: Reported on 11/20/2014) 15 tablet 0 Not Taking  . Prenatal Vit-Fe Fumarate-FA (EQL PRENATAL FORMULA) 28-0.8 MG TABS Take 1 tablet by mouth daily.   02/23/2015 at Unknown time  . promethazine (PHENERGAN) 25 MG tablet Take 1 tablet (25 mg total) by mouth every 6 (six) hours as needed for nausea or vomiting. 10 tablet 0 02/23/2015 at Unknown time    Review of Systems  Constitutional: Negative for fever and chills.  HENT: Negative.   Eyes: Negative.   Respiratory: Negative for cough, hemoptysis, shortness of breath and wheezing.   Cardiovascular: Negative for chest pain and palpitations.  Gastrointestinal: Positive for nausea, vomiting and abdominal pain. Negative for diarrhea  and constipation.       Intermittent stomach pains and tightening   Genitourinary: Negative for dysuria, urgency and frequency.  Musculoskeletal: Negative for myalgias and back pain.  Skin: Negative.   Neurological: Negative.   Endo/Heme/Allergies: Negative.   Psychiatric/Behavioral: Negative.    Physical Exam   Temperature 98.1 F (36.7 C), temperature source Oral, resp. rate 16, last menstrual period 08/23/2014.  Physical Exam  Constitutional: She appears well-developed and well-nourished.  HENT:  Head: Normocephalic.  Eyes: Pupils are equal, round, and reactive to light.  Neck: Normal range of motion.  Cardiovascular: Normal rate and regular rhythm.   Respiratory: Effort normal.  GI: There is no tenderness.  SVE in office: closed/ thick     Procedures  NST  Assessment and Plan  IUP at 27+5 weeks Likely viral gastroenteritis  IV Hydration Zofran PRN CBC, CMP, UA Fetal monitoring - NST  Dr. Feliberto Gottron aware and agrees with plan   Karena Addison 03/05/2015, 12:54 PM   Update at 1748: Pt. Feeling better and would like to be discharge home D/C home She has not had any vomiting since she has been here and tolerated ginger ale orally Note given for her to be out of work  Rest and try bland diet Preterm labor precautions given and warning s/s reviewed FKC's daily  Call for worsening s/s  All labs WNL D/C IV Reactive and reassuring NST for 27 weeks  FHR: baseline: 1356 bpm/ moderate variability / +accels / no decels TOCO: occasional UI, but decreased after IVF  Keep appointment in office on 12/21   Dr. Feliberto GottronSchermerhorn aware and agrees with plan

## 2015-03-05 NOTE — Discharge Instructions (Signed)
Drink plenty of fluid and get plenty of rest, start back slow with fluids and bland foods.

## 2015-03-22 NOTE — L&D Delivery Note (Signed)
Delivery Note At 12:24 PM a viable and healthy female "Kelly Roberts" was delivered via Vaginal, Spontaneous Delivery (Presentation: Left Occiput Anterior).  APGAR: 8, 9; weight 7 lb 11 oz (3487 g).   Placenta status: Intact, Spontaneous.  Cord: 3 vessels with the following complications: Mild shoulder dystocia x45 seconds. Maneuvers: McRoberts, suprapubic pressure and delivery of posterior arm performed. Body delivered easily.  Baby stimulated and suctioned on the perineum, and then transferred to peds care on the maternal abdomen. Delayed cord clamping of approx 30 sec performed during stimulation. Cord pH: pending. Cord pH collected because of mild shoulder dystocia and mild fetal heartrate decel to the 90s for several minutes prior to delivery. Cord blood collected for O positive maternal blood type.  CMN Sigmon delivered head and placenta. MD Dalbert GarnetBeasley performed shoulder dystocia maneuvers and delivered fetal body.   Anesthesia: Epidural  Episiotomy: None Lacerations: Labial Suture Repair: none Est. Blood Loss (mL): 250  Mom to postpartum.  Baby to Couplet care / Skin to Skin.  Christeen DouglasBEASLEY, [name] 05/26/2015, 12:59 PM

## 2015-04-10 ENCOUNTER — Observation Stay
Admission: EM | Admit: 2015-04-10 | Discharge: 2015-04-10 | Disposition: A | Discharge status: Home / Self Care | Payer: BLUE CROSS/BLUE SHIELD | Attending: Obstetrics and Gynecology | Admitting: Obstetrics and Gynecology

## 2015-04-10 DIAGNOSIS — Z3A32 32 weeks gestation of pregnancy: Secondary | ICD-10-CM | POA: Insufficient documentation

## 2015-04-10 DIAGNOSIS — Z3490 Encounter for supervision of normal pregnancy, unspecified, unspecified trimester: Secondary | ICD-10-CM

## 2015-04-10 NOTE — OB Triage Note (Signed)
Mrs. Lesure is a 24yo G2P1 at 32.[redacted] weeks gestation, complaining of contractions for the past 3 days.  She states that the contractions are approximately every 10 minutes and they keep her up at night. She states that hydration, tylenol, heating pad, and rest with no relief.  Patient suspects that she may also be leaking fluid but has not required a pad.

## 2015-04-10 NOTE — Progress Notes (Signed)
Patient discharged to home in stable condition.  Reviewed labor precautions and notified patient that the midwife Derek Jack will be working this weekend and has been given report.  Patient advised to continue with regularly schedule plan of care and to notify the midwife of any concerns.  She was also given a letter for work per her request. Lesia Hausen, RN

## 2015-04-20 NOTE — Final Progress Note (Signed)
Pt arrived with Th PTL at 32 weeks. Labor ruled out and pt discharged home to rest. FU as scheduled.

## 2015-04-28 ENCOUNTER — Inpatient Hospital Stay
Admission: EM | Admit: 2015-04-28 | Discharge: 2015-04-28 | Disposition: A | Discharge status: Home / Self Care | Payer: BLUE CROSS/BLUE SHIELD | Attending: Obstetrics and Gynecology | Admitting: Obstetrics and Gynecology

## 2015-04-28 DIAGNOSIS — O4703 False labor before 37 completed weeks of gestation, third trimester: Secondary | ICD-10-CM | POA: Diagnosis not present

## 2015-04-28 DIAGNOSIS — Z87891 Personal history of nicotine dependence: Secondary | ICD-10-CM | POA: Diagnosis not present

## 2015-04-28 DIAGNOSIS — Z3A35 35 weeks gestation of pregnancy: Secondary | ICD-10-CM | POA: Insufficient documentation

## 2015-04-28 DIAGNOSIS — R51 Headache: Secondary | ICD-10-CM | POA: Diagnosis not present

## 2015-04-28 DIAGNOSIS — K6289 Other specified diseases of anus and rectum: Secondary | ICD-10-CM | POA: Diagnosis present

## 2015-04-28 DIAGNOSIS — R109 Unspecified abdominal pain: Secondary | ICD-10-CM | POA: Diagnosis present

## 2015-04-28 LAB — URINALYSIS COMPLETE WITH MICROSCOPIC (ARMC ONLY)
BILIRUBIN URINE: NEGATIVE
Glucose, UA: 150 mg/dL — AB
Hgb urine dipstick: NEGATIVE
Leukocytes, UA: NEGATIVE
Nitrite: NEGATIVE
PROTEIN: NEGATIVE mg/dL
Specific Gravity, Urine: 1.016 (ref 1.005–1.030)
pH: 6 (ref 5.0–8.0)

## 2015-04-28 MED ORDER — ZOLPIDEM TARTRATE 5 MG PO TABS: 5.0000 mg | ORAL_TABLET | Freq: Every evening | ORAL | Status: DC | PRN

## 2015-04-28 MED ORDER — ACETAMINOPHEN 325 MG PO TABS
650.0000 mg | ORAL_TABLET | Freq: Once | ORAL | Status: AC
  Administered 2015-04-28: 650 mg via ORAL
  Filled 2015-04-28: qty 2

## 2015-04-28 NOTE — OB Triage Note (Signed)
Pt called l/d today. Stated she has been having rectal pressure, started yesterday. Pt has been in [place] with her mom a good part of the day. Will give water to PO hydrate.

## 2015-04-28 NOTE — OB Triage Provider Note (Signed)
History     CSN: [id number]  Arrival date and time: 04/28/15 1929   None     Chief Complaint  Patient presents with  . Abdominal Pain   Abdominal Pain Associated symptoms include headaches. Pertinent negatives include no dysuria or frequency.  Kelly Roberts is a 25 yo G2P1 at 35+3 weeks today presenting today with "rectal pressure, abdominal cramping, and intermittent low back pain."  She reports the pressure started last night, but she could not tell if she was constipated.  She states she had a normal bowel movement last night and one this morning, but was still feeling pressure "like I had to have a bowel movement."  She reports good fetal movement.  She denies vaginal bleeding, LOF, abnormal discharge, dysuria, n/v/c/d, SOB.  She does report a "tension headache" that started right before she got here.  She reports eating dinner prior to coming.  She states she has been in [place] today with her mother at her doctor's appointments. Pt. Also reports that she is not sleeping at night and has to sleep upright on the couch.  She reports taking Benadryl at night that helps her sleep 2-3 hours.   OB History    Gravida Para Term Preterm AB TAB SAB Ectopic Multiple Living   Past Medical History  Diagnosis Date  . Medical history non-contributory     Past Surgical History  Procedure Laterality Date  . No past surgeries      No family history on file.  Social History  Substance Use Topics  . Smoking status: Former Smoker    Types: Cigarettes    Quit date: 10/08/2014  . Smokeless tobacco: Never Used  . Alcohol Use: No    Allergies: No Known Allergies  Prescriptions prior to admission  Medication Sig Dispense Refill Last Dose  . Doxylamine-Pyridoxine (DICLEGIS) 10-10 MG TBEC Take 2 tablets by mouth at bedtime. Reported on 04/10/2015   Not Taking at Unknown time  . metoCLOPramide (REGLAN) 10 MG tablet Take 1 tablet (10 mg total) by mouth 3 (three) times  daily before meals. (Patient not taking: Reported on 11/20/2014) 15 tablet 0 Not Taking at Unknown time  . ondansetron (ZOFRAN-ODT) 4 MG disintegrating tablet Take 4 mg by mouth every 8 (eight) hours as needed for nausea or vomiting. Reported on 04/10/2015   Not Taking at Unknown time  . Prenatal Vit-Fe Fumarate-FA (EQL PRENATAL FORMULA) 28-0.8 MG TABS Take 1 tablet by mouth daily.   04/09/2015 at Unknown time  . promethazine (PHENERGAN) 25 MG tablet Take 1 tablet (25 mg total) by mouth every 6 (six) hours as needed for nausea or vomiting. (Patient not taking: Reported on 04/10/2015) 10 tablet 0 Not Taking at Unknown time    Review of Systems  Eyes: Negative.   Respiratory: Negative.   Cardiovascular: Negative.   Gastrointestinal: Positive for abdominal pain.       Abdominal cramping  Genitourinary: Negative for dysuria, urgency and frequency.  Musculoskeletal: Positive for back pain.       Intermittent low back pain   Neurological: Positive for headaches.  Endo/Heme/Allergies: Negative.   Psychiatric/Behavioral: Negative.    Physical Exam   Blood pressure 108/55, pulse 102, temperature 98.2 F (36.8 C), last menstrual period 08/23/2014.  Physical Exam  Constitutional: She is oriented to person, place, and time. She appears well-developed and well-nourished.  Cardiovascular: Normal rate and regular rhythm.   Respiratory:  Effort normal and breath sounds normal.  GI: Soft. Bowel sounds are normal.  Genitourinary: Uterus normal.  Gravid, non-tender  Musculoskeletal: Normal range of motion.  Neurological: She is alert and oriented to person, place, and time. She has normal reflexes.  Skin: Skin is warm and dry.  Psychiatric: She has a normal mood and affect.    Dilation: 1 Effacement (%): 40 Cervical Position: Posterior Station: -2 Presentation: Vertex   Fetal monitoring: 135bpm/moderate variability/+accels/no decels Toco: irregular contractions   Procedures    Assessment and  Plan  IUP at 35+3 weeks Threatened PTL Oral hydration  Tylenol  x1  Reassess cervix and pain in 1 hour  Karena Addison 04/28/2015, 8:41 PM   Reassessment at 2152: IUP at 35+3 weeks Category 1 Fetal Tracing Cervical exam unchanged Headache and some low back pain relieved from Tylenol / recommend heat and/or ice for pain Recommend OTC stool softener if she develops constipation  Strict PTL precautions and warning s/s reviewed FKC's daily Ambien  PO as needed at bedtime for sleep Dispense #20 tablets, no refills - strict use given F/U at [facility] on 04/30/15  Dr. Dalbert Garnet aware and agrees with plan  Carlean Jews, CNM

## 2015-04-28 NOTE — Plan of Care (Signed)
Meredith notified of urine results. Will recheck cervix. If unchanged will d/c home and have pt follow up on Thursday with c jones, cnm at [facility]

## 2015-04-28 NOTE — Plan of Care (Signed)
Pt d/c home with d/c instructions 

## 2015-04-30 ENCOUNTER — Ambulatory Visit: Payer: BLUE CROSS/BLUE SHIELD | Admitting: Licensed Clinical Social Worker

## 2015-04-30 LAB — OB RESULTS CONSOLE GBS: STREP GROUP B AG: NEGATIVE

## 2015-05-25 LAB — OB RESULTS CONSOLE RPR: RPR: NONREACTIVE

## 2015-05-25 LAB — OB RESULTS CONSOLE GC/CHLAMYDIA
Chlamydia: NEGATIVE
Gonorrhea: NEGATIVE

## 2015-05-26 ENCOUNTER — Inpatient Hospital Stay: Payer: Medicaid Other | Admitting: Anesthesiology

## 2015-05-26 ENCOUNTER — Inpatient Hospital Stay
Admission: EM | Admit: 2015-05-26 | Discharge: 2015-05-28 | DRG: 775 | Disposition: A | Discharge status: Home / Self Care | Payer: Medicaid Other | Attending: Obstetrics and Gynecology | Admitting: Obstetrics and Gynecology

## 2015-05-26 ENCOUNTER — Encounter: Payer: Self-pay | Admitting: *Deleted

## 2015-05-26 DIAGNOSIS — Z3483 Encounter for supervision of other normal pregnancy, third trimester: Secondary | ICD-10-CM | POA: Diagnosis present

## 2015-05-26 DIAGNOSIS — Z87891 Personal history of nicotine dependence: Secondary | ICD-10-CM

## 2015-05-26 DIAGNOSIS — Z3A00 Weeks of gestation of pregnancy not specified: Secondary | ICD-10-CM

## 2015-05-26 LAB — CBC
HEMATOCRIT: 33.3 % — AB (ref 35.0–47.0)
HEMOGLOBIN: 11 g/dL — AB (ref 12.0–16.0)
MCH: 28.4 pg (ref 26.0–34.0)
MCHC: 33.1 g/dL (ref 32.0–36.0)
MCV: 85.8 fL (ref 80.0–100.0)
PLATELETS: 154 10*3/uL (ref 150–440)
RBC: 3.88 MIL/uL (ref 3.80–5.20)
RDW: 14.3 % (ref 11.5–14.5)
WBC: 6.7 10*3/uL (ref 3.6–11.0)

## 2015-05-26 LAB — TYPE AND SCREEN
ABO/RH(D): O POS
Antibody Screen: NEGATIVE

## 2015-05-26 LAB — ABO/RH: ABO/RH(D): O POS

## 2015-05-26 MED ORDER — BENZOCAINE-MENTHOL 20-0.5 % EX AERO: 1.0000 "application " | INHALATION_SPRAY | CUTANEOUS | Status: DC | PRN

## 2015-05-26 MED ORDER — IBUPROFEN 600 MG PO TABS
600.0000 mg | ORAL_TABLET | Freq: Four times a day (QID) | ORAL | Status: DC
  Administered 2015-05-26 – 2015-05-28 (×9): 600 mg via ORAL
  Filled 2015-05-26 (×10): qty 1

## 2015-05-26 MED ORDER — SENNOSIDES-DOCUSATE SODIUM 8.6-50 MG PO TABS
2.0000 | ORAL_TABLET | ORAL | Status: DC
  Administered 2015-05-27: 2 via ORAL
  Filled 2015-05-26: qty 2

## 2015-05-26 MED ORDER — OXYTOCIN BOLUS FROM INFUSION: 500.0000 mL | INTRAVENOUS | Status: DC

## 2015-05-26 MED ORDER — SODIUM CHLORIDE 0.9 % IV SOLN: 250.0000 mL | INTRAVENOUS | Status: DC | PRN

## 2015-05-26 MED ORDER — WITCH HAZEL-GLYCERIN EX PADS: 1.0000 "application " | MEDICATED_PAD | CUTANEOUS | Status: DC | PRN

## 2015-05-26 MED ORDER — DIPHENHYDRAMINE HCL 25 MG PO CAPS
25.0000 mg | ORAL_CAPSULE | ORAL | Status: DC | PRN
  Administered 2015-05-26 – 2015-05-28 (×4): 25 mg via ORAL
  Filled 2015-05-26 (×4): qty 1

## 2015-05-26 MED ORDER — NALBUPHINE HCL 10 MG/ML IJ SOLN: 5.0000 mg | INTRAMUSCULAR | Status: DC | PRN

## 2015-05-26 MED ORDER — DEXTROSE 5 % IV SOLN
1.0000 ug/kg/h | INTRAVENOUS | Status: DC | PRN
  Filled 2015-05-26: qty 2

## 2015-05-26 MED ORDER — LANOLIN HYDROUS EX OINT: TOPICAL_OINTMENT | CUTANEOUS | Status: DC | PRN

## 2015-05-26 MED ORDER — SODIUM CHLORIDE 0.9% FLUSH: 3.0000 mL | INTRAVENOUS | Status: DC | PRN

## 2015-05-26 MED ORDER — DIPHENHYDRAMINE HCL 25 MG PO CAPS: 25.0000 mg | ORAL_CAPSULE | Freq: Four times a day (QID) | ORAL | Status: DC | PRN

## 2015-05-26 MED ORDER — NALBUPHINE HCL 10 MG/ML IJ SOLN: 5.0000 mg | Freq: Once | INTRAMUSCULAR | Status: DC | PRN

## 2015-05-26 MED ORDER — ONDANSETRON HCL 4 MG PO TABS: 4.0000 mg | ORAL_TABLET | ORAL | Status: DC | PRN

## 2015-05-26 MED ORDER — ONDANSETRON HCL 4 MG/2ML IJ SOLN: 4.0000 mg | INTRAMUSCULAR | Status: DC | PRN

## 2015-05-26 MED ORDER — LIDOCAINE-EPINEPHRINE (PF) 1.5 %-1:200000 IJ SOLN
INTRAMUSCULAR | Status: DC | PRN
  Administered 2015-05-26: 3 mL via PERINEURAL

## 2015-05-26 MED ORDER — BUPIVACAINE HCL (PF) 0.25 % IJ SOLN
INTRAMUSCULAR | Status: DC | PRN
  Administered 2015-05-26 (×2): 4 mL via EPIDURAL

## 2015-05-26 MED ORDER — LIDOCAINE HCL (PF) 1 % IJ SOLN
INTRAMUSCULAR | Status: DC | PRN
  Administered 2015-05-26: 3 mL

## 2015-05-26 MED ORDER — DIBUCAINE 1 % RE OINT: 1.0000 "application " | TOPICAL_OINTMENT | RECTAL | Status: DC | PRN

## 2015-05-26 MED ORDER — DIPHENHYDRAMINE HCL 50 MG/ML IJ SOLN: 12.5000 mg | INTRAMUSCULAR | Status: DC | PRN

## 2015-05-26 MED ORDER — ONDANSETRON HCL 4 MG/2ML IJ SOLN: 4.0000 mg | Freq: Four times a day (QID) | INTRAMUSCULAR | Status: DC | PRN

## 2015-05-26 MED ORDER — ONDANSETRON HCL 4 MG/2ML IJ SOLN: 4.0000 mg | Freq: Three times a day (TID) | INTRAMUSCULAR | Status: DC | PRN

## 2015-05-26 MED ORDER — ZOLPIDEM TARTRATE 5 MG PO TABS: 5.0000 mg | ORAL_TABLET | Freq: Every evening | ORAL | Status: DC | PRN

## 2015-05-26 MED ORDER — OXYCODONE-ACETAMINOPHEN 5-325 MG PO TABS
1.0000 | ORAL_TABLET | ORAL | Status: DC | PRN
  Administered 2015-05-26 – 2015-05-27 (×3): 1 via ORAL
  Filled 2015-05-26 (×3): qty 1

## 2015-05-26 MED ORDER — OXYTOCIN 40 UNITS IN LACTATED RINGERS INFUSION - SIMPLE MED: 2.5000 [IU]/h | INTRAVENOUS | Status: DC

## 2015-05-26 MED ORDER — ACETAMINOPHEN 325 MG PO TABS: 650.0000 mg | ORAL_TABLET | ORAL | Status: DC | PRN

## 2015-05-26 MED ORDER — MEASLES, MUMPS & RUBELLA VAC ~~LOC~~ INJ
0.5000 mL | INJECTION | Freq: Once | SUBCUTANEOUS | Status: AC
  Administered 2015-05-28: 0.5 mL via SUBCUTANEOUS
  Filled 2015-05-26 (×2): qty 0.5

## 2015-05-26 MED ORDER — FENTANYL 2.5 MCG/ML W/ROPIVACAINE 0.2% IN NS 100 ML EPIDURAL INFUSION (ARMC-ANES)
10.0000 mL/h | EPIDURAL | Status: DC
  Administered 2015-05-26: 10 mL/h via EPIDURAL

## 2015-05-26 MED ORDER — SIMETHICONE 80 MG PO CHEW
80.0000 mg | CHEWABLE_TABLET | ORAL | Status: DC | PRN
  Filled 2015-05-26: qty 1

## 2015-05-26 MED ORDER — FLEET ENEMA 7-19 GM/118ML RE ENEM: 1.0000 | ENEMA | RECTAL | Status: DC | PRN

## 2015-05-26 MED ORDER — FENTANYL 2.5 MCG/ML W/ROPIVACAINE 0.2% IN NS 100 ML EPIDURAL INFUSION (ARMC-ANES)
EPIDURAL | Status: AC
  Filled 2015-05-26: qty 100

## 2015-05-26 MED ORDER — LIDOCAINE HCL (PF) 1 % IJ SOLN: 30.0000 mL | INTRAMUSCULAR | Status: DC | PRN

## 2015-05-26 MED ORDER — CITRIC ACID-SODIUM CITRATE 334-500 MG/5ML PO SOLN: 30.0000 mL | ORAL | Status: DC | PRN

## 2015-05-26 MED ORDER — NALOXONE HCL 0.4 MG/ML IJ SOLN: 0.4000 mg | INTRAMUSCULAR | Status: DC | PRN

## 2015-05-26 MED ORDER — OXYCODONE-ACETAMINOPHEN 5-325 MG PO TABS
2.0000 | ORAL_TABLET | ORAL | Status: DC | PRN
  Administered 2015-05-26 – 2015-05-28 (×4): 2 via ORAL
  Filled 2015-05-26 (×4): qty 2

## 2015-05-26 MED ORDER — PRENATAL MULTIVITAMIN CH
1.0000 | ORAL_TABLET | Freq: Every day | ORAL | Status: DC
  Administered 2015-05-27 – 2015-05-28 (×2): 1 via ORAL
  Filled 2015-05-26 (×2): qty 1

## 2015-05-26 MED ORDER — BISACODYL 10 MG RE SUPP: 10.0000 mg | Freq: Every day | RECTAL | Status: DC | PRN

## 2015-05-26 MED ORDER — SODIUM CHLORIDE 0.9% FLUSH: 3.0000 mL | Freq: Two times a day (BID) | INTRAVENOUS | Status: DC

## 2015-05-26 MED ORDER — LACTATED RINGERS IV SOLN
500.0000 mL | INTRAVENOUS | Status: DC | PRN
  Administered 2015-05-26: 400 mL via INTRAVENOUS
  Administered 2015-05-26: 500 mL via INTRAVENOUS

## 2015-05-26 MED ORDER — FLEET ENEMA 7-19 GM/118ML RE ENEM: 1.0000 | ENEMA | Freq: Every day | RECTAL | Status: DC | PRN

## 2015-05-26 MED ORDER — LACTATED RINGERS IV SOLN
INTRAVENOUS | Status: DC
  Administered 2015-05-26: 21:00:00 via INTRAVENOUS

## 2015-05-26 NOTE — Anesthesia Preprocedure Evaluation (Signed)
Anesthesia Evaluation  Patient identified by MRN, date of birth, ID band Patient awake    Reviewed: Allergy & Precautions, NPO status , Patient's Chart, lab work & pertinent test results, Unable to perform ROS - Chart review only  History of Anesthesia Complications Negative for: history of anesthetic complications  Airway Mallampati: II  TM Distance: >3 FB Neck ROM: full  Mouth opening: Limited Mouth Opening  Dental no notable dental hx.    Pulmonary former smoker,    Pulmonary exam normal        Cardiovascular Exercise Tolerance: Good negative cardio ROS Normal cardiovascular exam     Neuro/Psych negative neurological ROS     GI/Hepatic Neg liver ROS, GERD  ,  Endo/Other  negative endocrine ROS  Renal/GU negative Renal ROS  negative genitourinary   Musculoskeletal negative musculoskeletal ROS (+)   Abdominal   Peds negative pediatric ROS (+)  Hematology negative hematology ROS (+)   Anesthesia Other Findings   Reproductive/Obstetrics                             Anesthesia Physical Anesthesia Plan  ASA: II  Anesthesia Plan: Epidural   Post-op Pain Management:    Induction:   Airway Management Planned:   Additional Equipment:   Intra-op Plan:   Post-operative Plan:   Informed Consent: I have reviewed the patients History and Physical, chart, labs and discussed the procedure including the risks, benefits and alternatives for the proposed anesthesia with the patient or authorized representative who has indicated his/her understanding and acceptance.     Plan Discussed with: Anesthesiologist  Anesthesia Plan Comments:         Anesthesia Quick Evaluation

## 2015-05-26 NOTE — Progress Notes (Signed)
Patient ID: Kelly Roberts, female   DOB: 1991/01/02, 25 y.o.   MRN: [id number] Records reviewed . CLe in place  VSS Reassuring fetal monitoring  AROM : 7 cm +2 station  A: active labor  P: Anticipate SVD

## 2015-05-26 NOTE — OB Triage Note (Signed)
24yo G2P1 presents at 8851w2d w/ c/o leaking fluid and contractions. No bleeding, +FM.

## 2015-05-26 NOTE — H&P (Signed)
Kelly Roberts is a 25 y.o. female presenting for contractions and LOF. Good fetal movement. EDC 05/31/15, dated by firm LMP.  Factors complicating this pregnancy   Rubella Non Immune  Screening results and needs:  NOB:   MBT O positive Ab screen Negative Pap HIV Negative Hep B/RPR Negative/Non reactive  Rubella Non Immune VZV Immune  Aneuploidy:   First trimester (Informaseq, NT): Neg Second trimester (AFP/tetra): Negative  28 weeks:   Blood consent: Signed 03/11/15  Hgb:10.2 Glucola:133 Rhogam: N/A  36 weeks:   GBS: Negative G/C: Negative Hgb: 10.5 RPR: non reacive  Last US: 01/14/15-vertex, post plac, normal anatomy  Immunization:   Flu in season - Declined 03/11/15  Tdap at 27-36 weeks - Given 03/11/15  Social: no changes  Contraception Plan: IUD  Feeding Plan: breast and formula   Maternal Medical History:  Reason for admission: Nausea.    OB History    Gravida Para Term Preterm AB TAB SAB Ectopic Multiple Living   2 1 1       1      Past Medical History  Diagnosis Date  . Medical history non-contributory    Past Surgical History  Procedure Laterality Date  . No past surgeries     Family History: family history is not on file. Social History:  reports that she quit smoking about 7 months ago. Her smoking use included Cigarettes. She has never used smokeless tobacco. She reports that she does not drink alcohol or use illicit drugs.    Review of Systems  Constitutional: Negative for fever and chills.  Eyes: Negative for blurred vision and double vision.  Respiratory: Negative for shortness of breath.   Cardiovascular: Negative for chest pain and palpitations.  Gastrointestinal: Negative for nausea, vomiting, abdominal pain, diarrhea and constipation.  Genitourinary: Negative for dysuria, urgency, frequency and flank pain.  Neurological: Negative for headaches.  Psychiatric/Behavioral: Negative for depression.    Dilation: 6.5 Effacement  (%): 90 Station: -2 Exam by:: AED Blood pressure 115/72, pulse 88, temperature 98.1 F (36.7 C), temperature source Oral, resp. rate 18, height 4\' 8"  (1.422 m), weight 65.318 kg (144 lb), last menstrual period 08/23/2014. Maternal Exam:  Uterine Assessment: Contraction strength is firm.  Contraction frequency is regular.   Abdomen: Patient reports no abdominal tenderness. Fetal presentation: vertex  Introitus: not evaluated.     Fetal Exam Fetal State Assessment: Category I - tracings are normal.     Physical Exam  Constitutional: She is oriented to person, place, and time. She appears well-developed and well-nourished. No distress.  Eyes: No scleral icterus.  Neck: Normal range of motion. Neck supple.  Cardiovascular: Normal rate.   Respiratory: Effort normal. No respiratory distress.  GI: Soft. She exhibits no distension. There is no tenderness.  Genitourinary: Vagina normal and uterus normal.  Musculoskeletal: Normal range of motion.  Neurological: She is alert and oriented to person, place, and time.  Skin: Skin is warm and dry.  Psychiatric: She has a normal mood and affect.    Prenatal labs: ABO, Rh: --/--/O POS (03/07 0411) Antibody: NEG (03/07 0411) Rubella:   non immune RPR: Nonreactive (03/06 0000)  HBsAg:   neg HIV:   neg GBS: Negative (02/09 0000)   Assessment/Plan: 1. Fetal Well being  - Fetal Tracing: Cat I - Group B Streptococcus ppx indicated: no - Presentation: vtx confirmed by leopolds   2. Routine OB: - Prenatal labs reviewed, as above - Rh pos  3. Monitoring of Labor:  -  Contractions external toco in place -  Pelvis proven to 7#11oz -  Plan for continuous fetal monitoring  -  Maternal pain control with iv or regional as desired - Anticipate vaginal delivery  4. Post Partum Planning: - Infant feeding: breast - Contraception: IUD   Christeen Douglas 05/26/2015, 8:32 AM

## 2015-05-26 NOTE — Anesthesia Procedure Notes (Signed)
Epidural Patient location during procedure: OB Start time: 05/26/2015 8:20 AM End time: 05/26/2015 8:37 AM  Staffing Anesthesiologist: Rosaria FerriesPISCITELLO, JOSEPH K Resident/CRNA: Ginger CarneMICHELET, Neenah Canter Performed by: resident/CRNA   Preanesthetic Checklist Completed: patient identified, site marked, surgical consent, pre-op evaluation, timeout performed, IV checked, risks and benefits discussed and monitors and equipment checked  Epidural Patient position: sitting Prep: Betadine Patient monitoring: heart rate, continuous pulse ox and blood pressure Approach: midline Location: L4-L5 Injection technique: LOR saline  Needle:  Needle type: Tuohy  Needle gauge: 17 G Needle length: 9 cm and 9 Catheter type: closed end flexible Catheter size: 19 Gauge Catheter at skin depth: 12 cm Test dose: negative and 1.5% lidocaine with Epi 1:200 K  Assessment Sensory level: T8 Events: blood not aspirated, injection not painful, no injection resistance, negative IV test and no paresthesia  Additional Notes   Patient tolerated the insertion well without immediate complications.Reason for block:procedure for pain

## 2015-05-27 LAB — CBC
HEMATOCRIT: 29.8 % — AB (ref 35.0–47.0)
HEMOGLOBIN: 10 g/dL — AB (ref 12.0–16.0)
MCH: 29.1 pg (ref 26.0–34.0)
MCHC: 33.4 g/dL (ref 32.0–36.0)
MCV: 87.1 fL (ref 80.0–100.0)
Platelets: 112 10*3/uL — ABNORMAL LOW (ref 150–440)
RBC: 3.43 MIL/uL — ABNORMAL LOW (ref 3.80–5.20)
RDW: 14.5 % (ref 11.5–14.5)
WBC: 7.9 10*3/uL (ref 3.6–11.0)

## 2015-05-27 LAB — RPR: RPR: NONREACTIVE

## 2015-05-27 NOTE — Anesthesia Postprocedure Evaluation (Signed)
Anesthesia Post Note  Patient: Kelly Roberts  Procedure(s) Performed: * No procedures listed *  Patient location during evaluation: Mother Baby Anesthesia Type: Epidural Level of consciousness: awake, awake and alert and oriented Pain management: pain level controlled Vital Signs Assessment: post-procedure vital signs reviewed and stable Respiratory status: spontaneous breathing Cardiovascular status: stable Postop Assessment: no headache and no backache Anesthetic complications: no    Last Vitals:  Filed Vitals:   05/27/15 0455 05/27/15 0713  BP: 95/47 103/64  Pulse: 77 75  Temp: 36.6 C 36.6 C  Resp: 18 18    Last Pain:  Filed Vitals:   05/27/15 0713  PainSc: 5                  Katalia Choma,  Jemarion Roycroft R

## 2015-05-27 NOTE — Progress Notes (Signed)
Post Partum Day 1 Subjective: no complaints Baby's bili elevated  Objective: Blood pressure 112/53, pulse 72, temperature 98.3 F (36.8 C), temperature source Oral, resp. rate 18, height 4\' 8"  (1.422 m), weight 144 lb (65.318 kg), last menstrual period 08/23/2014, SpO2 99 %.  Physical Exam:  General: alert and cooperative Lochia: appropriate Uterine Fundus: firm Incision: n/a DVT Evaluation: No evidence of DVT seen on physical exam.   Recent Labs  05/26/15 0411 05/27/15 0610  HGB 11.0* 10.0*  HCT 33.3* 29.8*    Assessment/Plan: Plan for discharge tomorrow   LOS: 1 day   Kelly Roberts 05/27/2015, 12:47 PM

## 2015-05-28 MED ORDER — IBUPROFEN 600 MG PO TABS: 600.0000 mg | ORAL_TABLET | Freq: Four times a day (QID) | ORAL | Status: DC

## 2015-05-28 NOTE — Progress Notes (Signed)
pt discharged home with infant.  Discharge instructions, prescriptions and follow up appointment given to and reviewed with pt.  Pt verbalized understanding, all questions answered.  Escorted by auxiliary. 

## 2015-05-28 NOTE — Discharge Summary (Signed)
Obstetric Discharge Summary Reason for Admission: onset of labor Prenatal Procedures: ultrasound Intrapartum Procedures: spontaneous vaginal delivery Postpartum Procedures: Rubella Ig Complications-Operative and Postpartum: none HEMOGLOBIN  Date Value Ref Range Status  05/27/2015 10.0* 12.0 - 16.0 g/dL Final   HGB  Date Value Ref Range Status  06/11/2014 11.7* 12.0-16.0 g/dL Final   HCT  Date Value Ref Range Status  05/27/2015 29.8* 35.0 - 47.0 % Final  06/11/2014 35.1 35.0-47.0 % Final    Physical Exam:  General: alert, cooperative, appears stated age and no distress Lochia: appropriate Uterine Fundus: firm Incision: none DVT Evaluation: No evidence of DVT seen on physical exam.  Discharge Diagnoses: Term Pregnancy-delivered  Discharge Information: Date: 05/28/2015 Activity: pelvic rest Diet: routine Medications: Colace Condition: stable Instructions: refer to practice specific booklet Discharge to: home Follow-up Information    Follow up with Christeen DouglasBEASLEY, Brenen Beigel, MD In 6 weeks.   Specialty:  Obstetrics and Gynecology   Why:  For postpartum visit   Contact information:   [address] [place] [id number] [phone]       Newborn Data: Live born female  Birth Weight: 7 lb 11 oz (3487 g) APGAR: 8, 9  Home with mother.  Christeen DouglasBEASLEY, Hung Rhinesmith 05/28/2015, 8:10 AM

## 2015-05-28 NOTE — Discharge Instructions (Signed)
Care After Vaginal Delivery °Congratulations on your new baby!! ° °Refer to this sheet in the next few weeks. These discharge instructions provide you with information on caring for yourself after delivery. Your caregiver may also give you specific instructions. Your treatment has been planned according to the most current medical practices available, but problems sometimes occur. Call your caregiver if you have any problems or questions after you go home. ° °HOME CARE INSTRUCTIONS °· Take over-the-counter or prescription medicines only as directed by your caregiver or pharmacist. °· Do not drink alcohol, especially if you are breastfeeding or taking medicine to relieve pain. °· Do not chew or smoke tobacco. °· Do not use illegal drugs. °· Continue to use good perineal care. Good perineal care includes: °¨ Wiping your perineum from front to back. °¨ Keeping your perineum clean. °· Do not use tampons or douche until your caregiver says it is okay. °· Shower, wash your hair, and take tub baths as directed by your caregiver. °· Wear a well-fitting bra that provides breast support. °· Eat healthy foods. °· Drink enough fluids to keep your urine clear or pale yellow. °· Eat high-fiber foods such as whole grain cereals and breads, brown rice, beans, and fresh fruits and vegetables every day. These foods may help prevent or relieve constipation. °· Follow your caregiver's recommendations regarding resumption of activities such as climbing stairs, driving, lifting, exercising, or traveling. Specifically, no driving for two weeks, so that you are comfortable reacting quickly in an emergency. °· Talk to your caregiver about resuming sexual activities. Resumption of sexual activities is dependent upon your risk of infection, your rate of healing, and your comfort and desire to resume sexual activity. Usually we recommend waiting about six weeks, or until your bleeding stops and you are interested in sex. °· Try to have someone  help you with your household activities and your newborn for at least a few days after you leave the hospital. Even longer is better. °· Rest as much as possible. Try to rest or take a nap when your newborn is sleeping. Sleep deprivation can be very hard after delivery. °· Increase your activities gradually. °· Keep all of your scheduled postpartum appointments. It is very important to keep your scheduled follow-up appointments. At these appointments, your caregiver will be checking to make sure that you are healing physically and emotionally. ° °SEEK MEDICAL CARE IF:  °· You are passing large clots from your vagina.  °· You have a foul smelling discharge from your vagina. °· You have trouble urinating. °· You are urinating frequently. °· You have pain when you urinate. °· You have a change in your bowel movements. °· You have increasing redness, pain, or swelling near your vaginal incision (episiotomy) or vaginal tear. °· You have pus draining from your episiotomy or vaginal tear. °· Your episiotomy or vaginal tear is separating. °· You have painful, hard, or reddened breasts. °· You have a severe headache. °· You have blurred vision or see spots. °· You feel sad or depressed. °· You have thoughts of hurting yourself or your newborn. °· You have questions about your care, the care of your newborn, or medicines. °· You are dizzy or light-headed. °· You have a rash. °· You have nausea or vomiting. °· You were breastfeeding and have not had a menstrual period within 12 weeks after you stopped breastfeeding. °· You are not breastfeeding and have not had a menstrual period by the 12th week after delivery. °· You   have a fever. ° °SEEK IMMEDIATE MEDICAL CARE IF:  °· You have persistent pain. °· You have chest pain. °· You have shortness of breath. °· You faint. °· You have leg pain. °· You have stomach pain. °· Your vaginal bleeding saturates two or more sanitary pads in 1 hour. ° °MAKE SURE YOU:  °· Understand these  instructions. °· Will get help right away if you are not doing well or get worse. °·  °Document Released: 03/04/2000 Document Revised: 07/22/2013 Document Reviewed: 11/02/2011 ° °[employer]® Patient Information ©2015 [employer]. This information is not intended to replace advice given to you by your health care provider. Make sure you discuss any questions you have with your health care provider. ° °

## 2015-05-28 NOTE — Progress Notes (Signed)
Discharge instructions, prescriptions and follow up appointment given to and reviewed with patient. Patient verbalized understanding. 

## 2015-11-17 ENCOUNTER — Emergency Department
Admission: EM | Admit: 2015-11-17 | Discharge: 2015-11-17 | Disposition: A | Discharge status: Home / Self Care | Payer: BLUE CROSS/BLUE SHIELD | Attending: Emergency Medicine | Admitting: Emergency Medicine

## 2015-11-17 ENCOUNTER — Encounter: Payer: Self-pay | Admitting: *Deleted

## 2015-11-17 DIAGNOSIS — Z79899 Other long term (current) drug therapy: Secondary | ICD-10-CM | POA: Insufficient documentation

## 2015-11-17 DIAGNOSIS — B9689 Other specified bacterial agents as the cause of diseases classified elsewhere: Secondary | ICD-10-CM

## 2015-11-17 DIAGNOSIS — N390 Urinary tract infection, site not specified: Secondary | ICD-10-CM | POA: Insufficient documentation

## 2015-11-17 DIAGNOSIS — N760 Acute vaginitis: Secondary | ICD-10-CM | POA: Insufficient documentation

## 2015-11-17 DIAGNOSIS — Z87891 Personal history of nicotine dependence: Secondary | ICD-10-CM | POA: Insufficient documentation

## 2015-11-17 LAB — COMPREHENSIVE METABOLIC PANEL
ALT: 8 U/L — AB (ref 14–54)
AST: 16 U/L (ref 15–41)
Albumin: 4.2 g/dL (ref 3.5–5.0)
Alkaline Phosphatase: 54 U/L (ref 38–126)
Anion gap: 6 (ref 5–15)
BUN: 20 mg/dL (ref 6–20)
CHLORIDE: 107 mmol/L (ref 101–111)
CO2: 24 mmol/L (ref 22–32)
CREATININE: 0.62 mg/dL (ref 0.44–1.00)
Calcium: 9 mg/dL (ref 8.9–10.3)
GFR calc non Af Amer: >60 mL/min (ref >60)
Glucose, Bld: 107 mg/dL — ABNORMAL HIGH (ref 65–99)
POTASSIUM: 3.8 mmol/L (ref 3.5–5.1)
SODIUM: 137 mmol/L (ref 135–145)
Total Bilirubin: 0.4 mg/dL (ref 0.3–1.2)
Total Protein: 7.6 g/dL (ref 6.5–8.1)

## 2015-11-17 LAB — WET PREP, GENITAL
Sperm: NONE SEEN
Trich, Wet Prep: NONE SEEN
Yeast Wet Prep HPF POC: NONE SEEN

## 2015-11-17 LAB — CBC
HEMATOCRIT: 33.4 % — AB (ref 35.0–47.0)
HEMOGLOBIN: 11.7 g/dL — AB (ref 12.0–16.0)
MCH: 31.4 pg (ref 26.0–34.0)
MCHC: 35 g/dL (ref 32.0–36.0)
MCV: 89.7 fL (ref 80.0–100.0)
Platelets: 175 10*3/uL (ref 150–440)
RBC: 3.72 MIL/uL — AB (ref 3.80–5.20)
RDW: 11.9 % (ref 11.5–14.5)
WBC: 6.3 10*3/uL (ref 3.6–11.0)

## 2015-11-17 LAB — URINALYSIS COMPLETE WITH MICROSCOPIC (ARMC ONLY)
BACTERIA UA: NONE SEEN
Bilirubin Urine: NEGATIVE
Glucose, UA: NEGATIVE mg/dL
HGB URINE DIPSTICK: NEGATIVE
KETONES UR: NEGATIVE mg/dL
NITRITE: NEGATIVE
PROTEIN: NEGATIVE mg/dL
SPECIFIC GRAVITY, URINE: 1.026 (ref 1.005–1.030)
pH: 6 (ref 5.0–8.0)

## 2015-11-17 LAB — CHLAMYDIA/NGC RT PCR (ARMC ONLY)
CHLAMYDIA TR: NOT DETECTED
N GONORRHOEAE: NOT DETECTED

## 2015-11-17 LAB — POCT PREGNANCY, URINE: PREG TEST UR: NEGATIVE

## 2015-11-17 MED ORDER — METRONIDAZOLE 500 MG PO TABS: 500.0000 mg | ORAL_TABLET | Freq: Two times a day (BID) | ORAL | 0 refills | Status: AC

## 2015-11-17 MED ORDER — NITROFURANTOIN MONOHYD MACRO 100 MG PO CAPS: 100.0000 mg | ORAL_CAPSULE | Freq: Two times a day (BID) | ORAL | 0 refills | Status: AC

## 2015-11-17 NOTE — ED Triage Notes (Signed)
Pt has left lower abd pain for 6 days.  No n/v/d.  No vag bleeding.  Pt has vag discharge. Pt has dysuria.  Pt also reports lower back pain.  No known injury.

## 2015-11-17 NOTE — ED Provider Notes (Signed)
[facility] Emergency Department Provider Note    ____________________________________________   I have reviewed the triage vital signs and the nursing notes.   HISTORY  Chief Complaint Abdominal Pain and Back Pain   History limited by: Not Limited   HPI Kelly Roberts is a 25 y.o. female who presents to the emergency department today because of concern for abdominal pain and low back pain. This has been present for the past 5 days. It does come and go. She has had associated pain with urination and increase in nocturnal frequency. Furthermore the patient has noticed increase in vaginal discharge. She denies any fevers, nausea or vomiting.   Past Medical History:  Diagnosis Date  . Medical history non-contributory     Patient Active Problem List   Diagnosis Date Noted  . Labor and delivery indication for care or intervention 05/26/2015  . Pregnancy 03/05/2015  . Back pain affecting pregnancy 02/24/2015  . First trimester screening 11/20/2014    Past Surgical History:  Procedure Laterality Date  . NO PAST SURGERIES      Prior to Admission medications   Medication Sig Start Date End Date Taking? Authorizing Provider  ibuprofen (ADVIL,MOTRIN) 600 MG tablet Take 1 tablet (600 mg total) by mouth every 6 (six) hours. 05/28/15   Christeen DouglasBethany Beasley, MD  ondansetron (ZOFRAN-ODT) 4 MG disintegrating tablet Take 4 mg by mouth every 8 (eight) hours as needed for nausea or vomiting. Reported on 04/10/2015    Historical Provider, MD  Prenatal Vit-Fe Fumarate-FA (EQL PRENATAL FORMULA) 28-0.8 MG TABS Take 1 tablet by mouth daily.    Historical Provider, MD  sertraline (ZOLOFT) 50 MG tablet Take 50 mg by mouth daily.    Historical Provider, MD  zolpidem (AMBIEN) 5 MG tablet Take 1 tablet (5 mg total) by mouth at bedtime as needed for sleep. 04/28/15   Karena AddisonMeredith C Sigmon, CNM    Allergies Review of patient's allergies indicates no known allergies.  No family history on  file.  Social History Social History  Substance Use Topics  . Smoking status: Former Smoker    Types: Cigarettes    Quit date: 10/08/2014  . Smokeless tobacco: Never Used  . Alcohol use No    Review of Systems  Constitutional: Negative for fever. Cardiovascular: Negative for chest pain. Respiratory: Negative for shortness of breath. Gastrointestinal: Positive for lower abdominal pain. Genitourinary: Positive for dysuria.  Neurological: Negative for headaches, focal weakness or numbness.  10-point ROS otherwise negative.  ____________________________________________   PHYSICAL EXAM:  VITAL SIGNS: ED Triage Vitals  Enc Vitals Group     BP 11/17/15 1811 (!) 116/58     Pulse Rate 11/17/15 1811 77     Resp 11/17/15 1811 18     Temp 11/17/15 1811 98.4 F (36.9 C)     Temp Source 11/17/15 1811 Oral     SpO2 11/17/15 1811 99 %     Weight 11/17/15 1813 120 lb (54.4 kg)     Height 11/17/15 1813 4\' 8"  (1.422 m)     Head Circumference --      Peak Flow --      Pain Score 11/17/15 1813 9   Constitutional: Alert and oriented. Well appearing and in no distress. Eyes: Conjunctivae are normal. Normal extraocular movements. ENT   Head: Normocephalic and atraumatic.   Nose: No congestion/rhinnorhea.   Mouth/Throat: Mucous membranes are moist.   Neck: No stridor. Hematological/Lymphatic/Immunilogical: No cervical lymphadenopathy. Cardiovascular: Normal rate, regular rhythm.  No murmurs, rubs,  or gallops. Respiratory: Normal respiratory effort without tachypnea nor retractions. Breath sounds are clear and equal bilaterally. No wheezes/rales/rhonchi. Gastrointestinal: Soft and nontender. No distention. No CVA tenderness. Genitourinary: No external lesions. Small amount of discharge in vaginal vault. No CMT. No adnexal fullness or pain. Musculoskeletal: Normal range of motion in all extremities. No lower extremity edema. Neurologic:  Normal speech and language. No gross  focal neurologic deficits are appreciated.  Skin:  Skin is warm, dry and intact. No rash noted. Psychiatric: Mood and affect are normal. Speech and behavior are normal. Patient exhibits appropriate insight and judgment.  ____________________________________________    LABS (pertinent positives/negatives)  Labs Reviewed  WET PREP, GENITAL - Abnormal; Notable for the following:       Result Value   Clue Cells Wet Prep HPF POC PRESENT (*)    WBC, Wet Prep HPF POC MODERATE (*)    All other components within normal limits  COMPREHENSIVE METABOLIC PANEL - Abnormal; Notable for the following:    Glucose, Bld 107 (*)    ALT 8 (*)    All other components within normal limits  CBC - Abnormal; Notable for the following:    RBC 3.72 (*)    Hemoglobin 11.7 (*)    HCT 33.4 (*)    All other components within normal limits  URINALYSIS COMPLETEWITH MICROSCOPIC ([facility] ONLY) - Abnormal; Notable for the following:    Color, Urine YELLOW (*)    APPearance CLEAR (*)    Leukocytes, UA TRACE (*)    Squamous Epithelial / LPF 0-5 (*)    All other components within normal limits  CHLAMYDIA/NGC RT PCR ([facility] ONLY)  POC URINE PREG, ED  POCT PREGNANCY, URINE     ____________________________________________   EKG  None  ____________________________________________    RADIOLOGY  None  ____________________________________________   PROCEDURES  Procedures  ____________________________________________   INITIAL IMPRESSION / ASSESSMENT AND PLAN / ED COURSE  Pertinent labs & imaging results that were available during my care of the patient were reviewed by me and considered in my medical decision making (see chart for details).  Patient presented to the emergency department today because of lower abdominal back pain as well as vaginal discharge. Patient does appear to have a urinary tract infection in addition the patient did test positive for bacterial vaginosis. Will discharge  with antibiotics for both. ____________________________________________   FINAL CLINICAL IMPRESSION(S) / ED DIAGNOSES  Final diagnoses:  UTI (lower urinary tract infection)  Bacterial vaginosis     Note: This dictation was prepared with Dragon dictation. Any transcriptional errors that result from this process are unintentional    Phineas Semen, MD 11/17/15 2222

## 2015-11-17 NOTE — Discharge Instructions (Signed)
Please seek medical attention for any high fevers, chest pain, shortness of breath, change in behavior, persistent vomiting, bloody stool or any other new or concerning symptoms.  

## 2015-11-17 NOTE — ED Notes (Signed)

## 2016-04-25 ENCOUNTER — Emergency Department
Admission: EM | Admit: 2016-04-25 | Discharge: 2016-04-25 | Disposition: A | Discharge status: Home / Self Care | Payer: BLUE CROSS/BLUE SHIELD | Attending: Emergency Medicine | Admitting: Emergency Medicine

## 2016-04-25 ENCOUNTER — Encounter: Payer: Self-pay | Admitting: Emergency Medicine

## 2016-04-25 DIAGNOSIS — N390 Urinary tract infection, site not specified: Secondary | ICD-10-CM | POA: Insufficient documentation

## 2016-04-25 DIAGNOSIS — Z791 Long term (current) use of non-steroidal anti-inflammatories (NSAID): Secondary | ICD-10-CM | POA: Insufficient documentation

## 2016-04-25 DIAGNOSIS — F17210 Nicotine dependence, cigarettes, uncomplicated: Secondary | ICD-10-CM | POA: Insufficient documentation

## 2016-04-25 DIAGNOSIS — J069 Acute upper respiratory infection, unspecified: Secondary | ICD-10-CM

## 2016-04-25 LAB — INFLUENZA PANEL BY PCR (TYPE A & B)
Influenza A By PCR: NEGATIVE
Influenza B By PCR: NEGATIVE

## 2016-04-25 MED ORDER — AZITHROMYCIN 250 MG PO TABS: ORAL_TABLET | ORAL | 0 refills | Status: AC

## 2016-04-25 MED ORDER — IBUPROFEN 600 MG PO TABS: 600.0000 mg | ORAL_TABLET | Freq: Three times a day (TID) | ORAL | 0 refills | Status: DC | PRN

## 2016-04-25 MED ORDER — ONDANSETRON 4 MG PO TBDP
4.0000 mg | ORAL_TABLET | Freq: Once | ORAL | Status: AC
  Administered 2016-04-25: 4 mg via ORAL
  Filled 2016-04-25: qty 1

## 2016-04-25 MED ORDER — METHYLPREDNISOLONE SODIUM SUCC 125 MG IJ SOLR: 125.0000 mg | Freq: Once | INTRAMUSCULAR | Status: DC

## 2016-04-25 MED ORDER — HYDROMORPHONE HCL 1 MG/ML IJ SOLN: 1.0000 mg | Freq: Once | INTRAMUSCULAR | Status: DC

## 2016-04-25 MED ORDER — FIRST-DUKES MOUTHWASH MT SUSP: 10.0000 mL | Freq: Four times a day (QID) | OROMUCOSAL | 0 refills | Status: DC

## 2016-04-25 MED ORDER — AMOXICILLIN-POT CLAVULANATE 875-125 MG PO TABS: 1.0000 | ORAL_TABLET | Freq: Two times a day (BID) | ORAL | 0 refills | Status: DC

## 2016-04-25 MED ORDER — METHYLPREDNISOLONE 4 MG PO TBPK: ORAL_TABLET | ORAL | 0 refills | Status: DC

## 2016-04-25 MED ORDER — KETOROLAC TROMETHAMINE 60 MG/2ML IM SOLN
60.0000 mg | Freq: Once | INTRAMUSCULAR | Status: AC
  Administered 2016-04-25: 60 mg via INTRAMUSCULAR
  Filled 2016-04-25: qty 2

## 2016-04-25 MED ORDER — PSEUDOEPH-BROMPHEN-DM 30-2-10 MG/5ML PO SYRP: 5.0000 mL | ORAL_SOLUTION | Freq: Four times a day (QID) | ORAL | 0 refills | Status: DC | PRN

## 2016-04-25 NOTE — ED Provider Notes (Signed)
[facility] Emergency Department Provider Note   ____________________________________________   First MD Initiated Contact with Patient 04/25/16 1223     (approximate)  I have reviewed the triage vital signs and the nursing notes.   HISTORY  Chief Complaint Nasal Congestion    HPI KARLIAH KOWALCHUK is a 26 y.o. female  complaining of nasal congestion and body aches. Patient stated intermittent rhinorrhea. Patient denies sore throat or cough. Patient state a lot of coworkers are sick in her jaw. Patient states she cannot room she took a flu shot this season. Patient rates the pain discomfort as a 9/10. She describes her pain as generalized body aches. No palliative measures taken for her complaint.   Past Medical History:  Diagnosis Date  . Medical history non-contributory     Patient Active Problem List   Diagnosis Date Noted  . Labor and delivery indication for care or intervention 05/26/2015  . Pregnancy 03/05/2015  . Back pain affecting pregnancy 02/24/2015  . First trimester screening 11/20/2014    Past Surgical History:  Procedure Laterality Date  . NO PAST SURGERIES      Prior to Admission medications   Medication Sig Start Date End Date Taking? Authorizing Provider  azithromycin (ZITHROMAX Z-PAK) 250 MG tablet Take 2 tablets (500 mg) on  Day 1,  followed by 1 tablet (250 mg) once daily on Days 2 through 5. 04/25/16 04/30/16  Joni Reining, PA-C  brompheniramine-pseudoephedrine-DM 30-2-10 MG/5ML syrup Take 5 mLs by mouth 4 (four) times daily as needed. 04/25/16   Joni Reining, PA-C  ibuprofen (ADVIL,MOTRIN) 600 MG tablet Take 1 tablet (600 mg total) by mouth every 6 (six) hours. 05/28/15   Christeen Douglas, MD  ibuprofen (ADVIL,MOTRIN) 600 MG tablet Take 1 tablet (600 mg total) by mouth every 8 (eight) hours as needed. 04/25/16   Joni Reining, PA-C  ondansetron (ZOFRAN-ODT) 4 MG disintegrating tablet Take 4 mg by mouth every 8 (eight) hours as  needed for nausea or vomiting. Reported on 04/10/2015    Historical Provider, MD  Prenatal Vit-Fe Fumarate-FA (EQL PRENATAL FORMULA) 28-0.8 MG TABS Take 1 tablet by mouth daily.    Historical Provider, MD  sertraline (ZOLOFT) 50 MG tablet Take 50 mg by mouth daily.    Historical Provider, MD  zolpidem (AMBIEN) 5 MG tablet Take 1 tablet (5 mg total) by mouth at bedtime as needed for sleep. 04/28/15   Karena Addison, CNM    Allergies Patient has no known allergies.  No family history on file.  Social History Social History  Substance Use Topics  . Smoking status: Current Every Day Smoker    Packs/day: 0.25    Types: Cigarettes    Last attempt to quit: 10/08/2014  . Smokeless tobacco: Never Used  . Alcohol use Yes     Comment: occasionally    Review of Systems Constitutional: Body ache Eyes: No visual changes. ENT: No sore throat. Nasal congestion Cardiovascular: Denies chest pain. Respiratory: Denies shortness of breath. Gastrointestinal: No abdominal pain.  No nausea, no vomiting.  No diarrhea.  No constipation. Genitourinary: Negative for dysuria. Musculoskeletal: Negative for back pain. Skin: Negative for rash. Neurological: Negative for headaches, focal weakness or numbness.    ____________________________________________   PHYSICAL EXAM:  VITAL SIGNS: ED Triage Vitals [04/25/16 1222]  Enc Vitals Group     BP      Pulse      Resp      Temp  Temp src      SpO2      Weight 125 lb (56.7 kg)     Height 4\' 8"  (1.422 m)     Head Circumference      Peak Flow      Pain Score 9     Pain Loc      Pain Edu?      Excl. in GC?     Constitutional: Alert and oriented. Well appearing and in no acute distress. Eyes: Conjunctivae are normal. PERRL. EOMI. Head: Atraumatic. Nose: No congestion/rhinnorhea. Mouth/Throat: Mucous membranes are moist.  Oropharynx non-erythematous. Neck: No stridor.  No cervical spine tenderness to  palpation. Hematological/Lymphatic/Immunilogical: No cervical lymphadenopathy. Cardiovascular: Normal rate, regular rhythm. Grossly normal heart sounds.  Good peripheral circulation. Respiratory: Normal respiratory effort.  No retractions. Lungs CTAB. Gastrointestinal: Soft and nontender. No distention. No abdominal bruits. No CVA tenderness. Musculoskeletal: No lower extremity tenderness nor edema.  No joint effusions. Neurologic:  Normal speech and language. No gross focal neurologic deficits are appreciated. No gait instability. Skin:  Skin is warm, dry and intact. No rash noted. Psychiatric: Mood and affect are normal. Speech and behavior are normal.  ____________________________________________   LABS (all labs ordered are listed, but only abnormal results are displayed)  Labs Reviewed  INFLUENZA PANEL BY PCR (TYPE A & B)   ____________________________________________  EKG   ____________________________________________  RADIOLOGY   ____________________________________________   PROCEDURES  Procedure(s) performed: None  Procedures  Critical Care performed: No  ____________________________________________   INITIAL IMPRESSION / ASSESSMENT AND PLAN / ED COURSE  Pertinent labs & imaging results that were available during my care of the patient were reviewed by me and considered in my medical decision making (see chart for details). Respiratory infection. Patient given discharge Instructions. Patient given prescription for Zithromax Bromfed-DM and ibuprofen. Patient given a work note and advised follow-up family doctor condition persists.    ____________________________________________   FINAL CLINICAL IMPRESSION(S) / ED DIAGNOSES  Final diagnoses:  Upper respiratory tract infection, unspecified type      NEW MEDICATIONS STARTED DURING THIS VISIT:  Discharge Medication List as of 04/25/2016  2:27 PM    START taking these medications   Details   azithromycin (ZITHROMAX Z-PAK) 250 MG tablet Take 2 tablets (500 mg) on  Day 1,  followed by 1 tablet (250 mg) once daily on Days 2 through 5., Print    brompheniramine-pseudoephedrine-DM 30-2-10 MG/5ML syrup Take 5 mLs by mouth 4 (four) times daily as needed., Starting Mon 04/25/2016, Print    !! ibuprofen (ADVIL,MOTRIN) 600 MG tablet Take 1 tablet (600 mg total) by mouth every 8 (eight) hours as needed., Starting Mon 04/25/2016, Print     !! - Potential duplicate medications found. Please discuss with provider.       Note:  This document was prepared using Dragon voice recognition software and may include unintentional dictation errors.    Joni Reiningonald K Smith, PA-C 04/25/16 1614    Emily FilbertJonathan E Williams, MD 04/26/16 1125

## 2016-04-25 NOTE — ED Triage Notes (Signed)
Patient presents to the ED with nasal congestion and body aches that began yesterday.  Patient denies fever, denies cough, and denies sore throat.  Patient is in no obvious distress at this time.

## 2016-05-14 ENCOUNTER — Emergency Department
Admission: EM | Admit: 2016-05-14 | Discharge: 2016-05-14 | Disposition: A | Discharge status: Home / Self Care | Payer: Self-pay | Attending: Emergency Medicine | Admitting: Emergency Medicine

## 2016-05-14 ENCOUNTER — Emergency Department: Payer: Self-pay

## 2016-05-14 DIAGNOSIS — F17210 Nicotine dependence, cigarettes, uncomplicated: Secondary | ICD-10-CM | POA: Insufficient documentation

## 2016-05-14 DIAGNOSIS — Z791 Long term (current) use of non-steroidal anti-inflammatories (NSAID): Secondary | ICD-10-CM | POA: Insufficient documentation

## 2016-05-14 DIAGNOSIS — W2209XA Striking against other stationary object, initial encounter: Secondary | ICD-10-CM | POA: Insufficient documentation

## 2016-05-14 DIAGNOSIS — Y939 Activity, unspecified: Secondary | ICD-10-CM | POA: Insufficient documentation

## 2016-05-14 DIAGNOSIS — Y999 Unspecified external cause status: Secondary | ICD-10-CM | POA: Insufficient documentation

## 2016-05-14 DIAGNOSIS — S62603A Fracture of unspecified phalanx of left middle finger, initial encounter for closed fracture: Secondary | ICD-10-CM | POA: Insufficient documentation

## 2016-05-14 DIAGNOSIS — Y929 Unspecified place or not applicable: Secondary | ICD-10-CM | POA: Insufficient documentation

## 2016-05-14 DIAGNOSIS — Z79899 Other long term (current) drug therapy: Secondary | ICD-10-CM | POA: Insufficient documentation

## 2016-05-14 MED ORDER — NAPROXEN 500 MG PO TABS
500.0000 mg | ORAL_TABLET | Freq: Once | ORAL | Status: AC
  Administered 2016-05-14: 500 mg via ORAL
  Filled 2016-05-14: qty 1

## 2016-05-14 MED ORDER — NAPROXEN 500 MG PO TABS: 500.0000 mg | ORAL_TABLET | Freq: Two times a day (BID) | ORAL | 0 refills | Status: DC

## 2016-05-14 MED ORDER — TRAMADOL HCL 50 MG PO TABS
50.0000 mg | ORAL_TABLET | Freq: Once | ORAL | Status: DC
  Filled 2016-05-14: qty 1

## 2016-05-14 MED ORDER — TRAMADOL HCL 50 MG PO TABS: 50.0000 mg | ORAL_TABLET | Freq: Four times a day (QID) | ORAL | 0 refills | Status: DC | PRN

## 2016-05-14 NOTE — Discharge Instructions (Signed)
Wear splint until evaluation by orthopedic Dr. °

## 2016-05-14 NOTE — ED Notes (Signed)
Pt alert and oriented X4, active, cooperative, pt in NAD. RR even and unlabored, color WNL.  Pt informed to return if any life threatening symptoms occur.   

## 2016-05-14 NOTE — ED Provider Notes (Signed)
[facility] Emergency Department Provider Note   ____________________________________________   None    (approximate)  I have reviewed the triage vital signs and the nursing notes.   HISTORY  Chief Complaint Finger Injury    HPI Kelly Roberts is a 26 y.o. female is complaining of pain and decreased range of motion with extension of the third digit right hand secondary to hitting the door last night. Patient rated her pain as a 10 over 10. Patient had a pain as "achy". No palliative measures for this complaint. Patient is right-hand dominant.   Past Medical History:  Diagnosis Date  . Medical history non-contributory     Patient Active Problem List   Diagnosis Date Noted  . Labor and delivery indication for care or intervention 05/26/2015  . Pregnancy 03/05/2015  . Back pain affecting pregnancy 02/24/2015  . First trimester screening 11/20/2014    Past Surgical History:  Procedure Laterality Date  . NO PAST SURGERIES      Prior to Admission medications   Medication Sig Start Date End Date Taking? Authorizing Provider  brompheniramine-pseudoephedrine-DM 30-2-10 MG/5ML syrup Take 5 mLs by mouth 4 (four) times daily as needed. 04/25/16   Joni Reiningonald K Brisia Schuermann, PA-C  ibuprofen (ADVIL,MOTRIN) 600 MG tablet Take 1 tablet (600 mg total) by mouth every 6 (six) hours. 05/28/15   Christeen DouglasBethany Beasley, MD  ibuprofen (ADVIL,MOTRIN) 600 MG tablet Take 1 tablet (600 mg total) by mouth every 8 (eight) hours as needed. 04/25/16   Joni Reiningonald K Jamillia Closson, PA-C  naproxen (NAPROSYN) 500 MG tablet Take 1 tablet (500 mg total) by mouth 2 (two) times daily with a meal. 05/14/16   Joni Reiningonald K Katianne Barre, PA-C  ondansetron (ZOFRAN-ODT) 4 MG disintegrating tablet Take 4 mg by mouth every 8 (eight) hours as needed for nausea or vomiting. Reported on 04/10/2015    Historical Provider, MD  Prenatal Vit-Fe Fumarate-FA (EQL PRENATAL FORMULA) 28-0.8 MG TABS Take 1 tablet by mouth daily.    Historical Provider,  MD  sertraline (ZOLOFT) 50 MG tablet Take 50 mg by mouth daily.    Historical Provider, MD  traMADol (ULTRAM) 50 MG tablet Take 1 tablet (50 mg total) by mouth every 6 (six) hours as needed for moderate pain. 05/14/16   Joni Reiningonald K Elita Dame, PA-C  zolpidem (AMBIEN) 5 MG tablet Take 1 tablet (5 mg total) by mouth at bedtime as needed for sleep. 04/28/15   Karena AddisonMeredith C Sigmon, CNM    Allergies Patient has no known allergies.  No family history on file.  Social History Social History  Substance Use Topics  . Smoking status: Current Every Day Smoker    Packs/day: 0.25    Types: Cigarettes    Last attempt to quit: 10/08/2014  . Smokeless tobacco: Never Used  . Alcohol use Yes     Comment: occasionally    Review of Systems Constitutional: No fever/chills Eyes: No visual changes. ENT: No sore throat. Cardiovascular: Denies chest pain. Respiratory: Denies shortness of breath. Gastrointestinal: No abdominal pain.  No nausea, no vomiting.  No diarrhea.  No constipation. Genitourinary: Negative for dysuria. Musculoskeletal:Pain to the third digit right hand. Skin: Negative for rash. Neurological: Negative for headaches, focal weakness or numbness.    ____________________________________________   PHYSICAL EXAM:  VITAL SIGNS: ED Triage Vitals [05/14/16 1155]  Enc Vitals Group     BP 119/68     Pulse Rate 85     Resp 15     Temp 98.7 F (37.1 C)  Temp Source Oral     SpO2 100 %     Weight 125 lb (56.7 kg)     Height 4\' 8"  (1.422 m)     Head Circumference      Peak Flow      Pain Score 10     Pain Loc      Pain Edu?      Excl. in GC?     Constitutional: Alert and oriented. Well appearing and in no acute distress. Eyes: Conjunctivae are normal. PERRL. EOMI. Head: Atraumatic. Nose: No congestion/rhinnorhea. Mouth/Throat: Mucous membranes are moist.  Oropharynx non-erythematous. Neck: No stridor.  No cervical spine tenderness to  palpation. Hematological/Lymphatic/Immunilogical: No cervical lymphadenopathy. Cardiovascular: Normal rate, regular rhythm. Grossly normal heart sounds.  Good peripheral circulation. Respiratory: Normal respiratory effort.  No retractions. Lungs CTAB. Gastrointestinal: Soft and nontender. No distention. No abdominal bruits. No CVA tenderness. Musculoskeletal: No lower extremity tenderness nor edema.  No joint effusions. Neurologic:  Normal speech and language. No gross focal neurologic deficits are appreciated. No gait instability. Skin:  Skin is warm, dry and intact. No rash noted. Psychiatric: Mood and affect are normal. Speech and behavior are normal.  ____________________________________________   LABS (all labs ordered are listed, but only abnormal results are displayed)  Labs Reviewed - No data to display ____________________________________________  EKG   ____________________________________________  RADIOLOGY   ____________________________________________   PROCEDURES  Procedure(s) performed: None  Procedures  Critical Care performed: No  ____________________________________________   INITIAL IMPRESSION / ASSESSMENT AND PLAN / ED COURSE  Pertinent labs & imaging results that were available during my care of the patient were reviewed by me and considered in my medical decision making (see chart for details).  Distal phalangeal fracture third digit right hand. Patient given discharge care instructions. Patient advised to wear splint evaluation orthopedics. Patient given a prescription for tramadol and ibuprofen.      ____________________________________________   FINAL CLINICAL IMPRESSION(S) / ED DIAGNOSES  Final diagnoses:  Fracture of unspecified phalanx of left middle finger, initial encounter for closed fracture      NEW MEDICATIONS STARTED DURING THIS VISIT:  New Prescriptions   NAPROXEN (NAPROSYN) 500 MG TABLET    Take 1 tablet (500 mg  total) by mouth 2 (two) times daily with a meal.   TRAMADOL (ULTRAM) 50 MG TABLET    Take 1 tablet (50 mg total) by mouth every 6 (six) hours as needed for moderate pain.     Note:  This document was prepared using Dragon voice recognition software and may include unintentional dictation errors.    Joni Reining, PA-C 05/14/16 1242    Governor Rooks, MD 05/14/16 [phone]

## 2016-05-14 NOTE — ED Triage Notes (Signed)
Right hand, 3rd digit pain after hitting finger on door last night. No swelling, discoloration. Pt alert and oriented X4, active, cooperative, pt in NAD. RR even and unlabored, color WNL.

## 2016-12-06 ENCOUNTER — Encounter: Payer: Self-pay | Admitting: Emergency Medicine

## 2016-12-06 ENCOUNTER — Emergency Department
Admission: EM | Admit: 2016-12-06 | Discharge: 2016-12-07 | Disposition: A | Discharge status: Home / Self Care | Payer: BLUE CROSS/BLUE SHIELD | Attending: Emergency Medicine | Admitting: Emergency Medicine

## 2016-12-06 DIAGNOSIS — N760 Acute vaginitis: Secondary | ICD-10-CM | POA: Insufficient documentation

## 2016-12-06 DIAGNOSIS — B9689 Other specified bacterial agents as the cause of diseases classified elsewhere: Secondary | ICD-10-CM

## 2016-12-06 DIAGNOSIS — Z791 Long term (current) use of non-steroidal anti-inflammatories (NSAID): Secondary | ICD-10-CM | POA: Insufficient documentation

## 2016-12-06 DIAGNOSIS — Z79899 Other long term (current) drug therapy: Secondary | ICD-10-CM | POA: Insufficient documentation

## 2016-12-06 DIAGNOSIS — F17210 Nicotine dependence, cigarettes, uncomplicated: Secondary | ICD-10-CM | POA: Insufficient documentation

## 2016-12-06 LAB — URINALYSIS, COMPLETE (UACMP) WITH MICROSCOPIC
BILIRUBIN URINE: NEGATIVE
Glucose, UA: NEGATIVE mg/dL
HGB URINE DIPSTICK: NEGATIVE
KETONES UR: NEGATIVE mg/dL
LEUKOCYTES UA: NEGATIVE
NITRITE: NEGATIVE
PROTEIN: 30 mg/dL — AB
RBC / HPF: NONE SEEN RBC/hpf (ref 0–5)
Specific Gravity, Urine: 1.021 (ref 1.005–1.030)
WBC UA: NONE SEEN WBC/hpf (ref 0–5)
pH: 8 (ref 5.0–8.0)

## 2016-12-06 LAB — LIPASE, BLOOD: Lipase: 23 U/L (ref 11–51)

## 2016-12-06 LAB — POCT PREGNANCY, URINE: Preg Test, Ur: NEGATIVE

## 2016-12-06 LAB — CBC
HCT: 32.1 % — ABNORMAL LOW (ref 35.0–47.0)
Hemoglobin: 11.3 g/dL — ABNORMAL LOW (ref 12.0–16.0)
MCH: 31.9 pg (ref 26.0–34.0)
MCHC: 35.4 g/dL (ref 32.0–36.0)
MCV: 90.1 fL (ref 80.0–100.0)
Platelets: 194 10*3/uL (ref 150–440)
RBC: 3.56 MIL/uL — ABNORMAL LOW (ref 3.80–5.20)
RDW: 12 % (ref 11.5–14.5)
WBC: 6.1 10*3/uL (ref 3.6–11.0)

## 2016-12-06 LAB — COMPREHENSIVE METABOLIC PANEL
ALBUMIN: 4 g/dL (ref 3.5–5.0)
ALK PHOS: 48 U/L (ref 38–126)
ALT: 8 U/L — ABNORMAL LOW (ref 14–54)
ANION GAP: 7 (ref 5–15)
AST: 20 U/L (ref 15–41)
BILIRUBIN TOTAL: 0.3 mg/dL (ref 0.3–1.2)
BUN: 14 mg/dL (ref 6–20)
CALCIUM: 9 mg/dL (ref 8.9–10.3)
CO2: 23 mmol/L (ref 22–32)
Chloride: 109 mmol/L (ref 101–111)
Creatinine, Ser: 0.75 mg/dL (ref 0.44–1.00)
GFR calc Af Amer: >60 mL/min (ref >60)
Glucose, Bld: 138 mg/dL — ABNORMAL HIGH (ref 65–99)
Potassium: 3.4 mmol/L — ABNORMAL LOW (ref 3.5–5.1)
Sodium: 139 mmol/L (ref 135–145)
TOTAL PROTEIN: 6.9 g/dL (ref 6.5–8.1)

## 2016-12-06 LAB — WET PREP, GENITAL
Clue Cells Wet Prep HPF POC: NONE SEEN
Sperm: NONE SEEN
Trich, Wet Prep: NONE SEEN
Yeast Wet Prep HPF POC: NONE SEEN

## 2016-12-06 NOTE — ED Provider Notes (Signed)
[facility] Emergency Department Provider Note   ____________________________________________   First MD Initiated Contact with Patient 12/06/16 2301     (approximate)  I have reviewed the triage vital signs and the nursing notes.   HISTORY  Chief Complaint Abdominal Pain    HPI Kelly Roberts is a 26 y.o. female reevaluation of lower abdominal discomfort  Patient reports about 2 weeks ago she began experiencing a slightly increased discomfort that she describes as a crampy pain feels similar to her menstrual cycle.  Currently mild to minimal, but at times seems worse especially when she is working at one of her 2 jobs. He does have a history of previous vaginosis that felt similar. He reports a slight white discharge also occurring. Has an IUD in place. Denies pregnancy. No pain in the upper abdomen. Denies any severe pain, no fevers. Experience mild nausea and she thinks "due to the pain" yesterday and vomited once last night. She since been eating and drinking normally without diarrhea or changes in stool   Past Medical History:  Diagnosis Date  . Medical history non-contributory     Patient Active Problem List   Diagnosis Date Noted  . Labor and delivery indication for care or intervention 05/26/2015  . Pregnancy 03/05/2015  . Back pain affecting pregnancy 02/24/2015  . First trimester screening 11/20/2014    Past Surgical History:  Procedure Laterality Date  . NO PAST SURGERIES      Prior to Admission medications   Medication Sig Start Date End Date Taking? Authorizing Provider  brompheniramine-pseudoephedrine-DM 30-2-10 MG/5ML syrup Take 5 mLs by mouth 4 (four) times daily as needed. 04/25/16   Joni Reining, PA-C  ibuprofen (ADVIL,MOTRIN) 600 MG tablet Take 1 tablet (600 mg total) by mouth every 6 (six) hours. 05/28/15   Christeen Douglas, MD  ibuprofen (ADVIL,MOTRIN) 600 MG tablet Take 1 tablet (600 mg total) by mouth every 8 (eight)  hours as needed. 04/25/16   Joni Reining, PA-C  metroNIDAZOLE (FLAGYL) 500 MG tablet Take 1 tablet (500 mg total) by mouth 3 (three) times daily. 12/07/16   Sharyn Creamer, MD  naproxen (NAPROSYN) 500 MG tablet Take 1 tablet (500 mg total) by mouth 2 (two) times daily with a meal. 05/14/16   Joni Reining, PA-C  ondansetron (ZOFRAN ODT) 4 MG disintegrating tablet Take 1 tablet (4 mg total) by mouth every 6 (six) hours as needed for nausea or vomiting. 12/07/16   Sharyn Creamer, MD  Prenatal Vit-Fe Fumarate-FA (EQL PRENATAL FORMULA) 28-0.8 MG TABS Take 1 tablet by mouth daily.    [provider]  sertraline (ZOLOFT) 50 MG tablet Take 50 mg by mouth daily.    [provider]  traMADol (ULTRAM) 50 MG tablet Take 1 tablet (50 mg total) by mouth every 6 (six) hours as needed for moderate pain. 05/14/16   Joni Reining, PA-C  zolpidem (AMBIEN) 5 MG tablet Take 1 tablet (5 mg total) by mouth at bedtime as needed for sleep. 04/28/15   Karena Addison, CNM    Allergies Patient has no known allergies.  No family history on file.  Social History Social History  Substance Use Topics  . Smoking status: Current Every Day Smoker    Packs/day: 0.25    Types: Cigarettes    Last attempt to quit: 10/08/2014  . Smokeless tobacco: Never Used  . Alcohol use Yes     Comment: occasionally    Review of Systems Constitutional: No fever/chills  Eyes: No visual changes. ENT: No sore throat. Cardiovascular: Denies chest pain. Respiratory: Denies shortness of breath. Gastrointestinal:   No diarrhea.  No constipation. Genitourinary: Negative for dysuria.feels when her bladder is fully slight discomfort that radiates into her back. Denies that her urine itself feels unusual for abnormal odor Musculoskeletal: see history of present illness Skin: Negative for rash. Neurological: Negative for headaches, focal weakness or numbness.    ____________________________________________   PHYSICAL  EXAM:  VITAL SIGNS: ED Triage Vitals  Enc Vitals Group     BP 12/06/16 2045 130/76     Pulse Rate 12/06/16 2045 83     Resp 12/06/16 2045 20     Temp 12/06/16 2045 98.4 F (36.9 C)     Temp Source 12/06/16 2045 Oral     SpO2 12/06/16 2045 100 %     Weight 12/06/16 2045 120 lb (54.4 kg)     Height 12/06/16 2045  (1.422 m)     Head Circumference --      Peak Flow --      Pain Score 12/06/16 2044 10     Pain Loc --      Pain Edu? --      Excl. in GC? --     Constitutional: Alert and oriented. Well appearing and in no acute distress. Eyes: Conjunctivae are normal. Head: Atraumatic. Nose: No congestion/rhinnorhea. Mouth/Throat: Mucous membranes are moist. Neck: No stridor.   Cardiovascular: Normal rate, regular rhythm. Grossly normal heart sounds.  Good peripheral circulation. Respiratory: Normal respiratory effort.  No retractions. Lungs CTAB. Gastrointestinal: Soft and nontender2 for minimal discomfort to palpation suprapubically. No pain at McBurney's point. Negative Murphy. No distention. Musculoskeletal: No lower extremity tenderness nor edema. genitourinary exam: Escorted by nurse Dorathy Daft. Normal external exam. Internal exam demonstrates slight whitish to clear discharge and a slightly abnormal odor, no cervical motion tenderness, the cervix appears slightly irritable. IUD strings are in place. No heavy or purulent dischargeand no adnexal tenderness Neurologic:  Normal speech and language. No gross focal neurologic deficits are appreciated.  Skin:  Skin is warm, dry and intact. No rash noted. Psychiatric: Mood and affect are normal. Speech and behavior are normal.  ____________________________________________   LABS (all labs ordered are listed, but only abnormal results are displayed)  Labs Reviewed  WET PREP, GENITAL - Abnormal; Notable for the following:       Result Value   WBC, Wet Prep HPF POC FEW (*)    All other components within normal limits  COMPREHENSIVE  METABOLIC PANEL - Abnormal; Notable for the following:    Potassium 3.4 (*)    Glucose, Bld 138 (*)    ALT 8 (*)    All other components within normal limits  CBC - Abnormal; Notable for the following:    RBC 3.56 (*)    Hemoglobin 11.3 (*)    HCT 32.1 (*)    All other components within normal limits  URINALYSIS, COMPLETE (UACMP) WITH MICROSCOPIC - Abnormal; Notable for the following:    Color, Urine YELLOW (*)    APPearance CLOUDY (*)    Protein, ur 30 (*)    Bacteria, UA RARE (*)    Squamous Epithelial / LPF 0-5 (*)    All other components within normal limits  CHLAMYDIA/NGC RT PCR ([facility] ONLY)  LIPASE, BLOOD  POC URINE PREG, ED  POCT PREGNANCY, URINE   ____________________________________________  EKG   ____________________________________________  RADIOLOGY   ____________________________________________   PROCEDURES  Procedure(s) performed: None  Procedures  Critical Care performed: No  ____________________________________________   INITIAL IMPRESSION / ASSESSMENT AND PLAN / ED COURSE  Pertinent labs & imaging results that were available during my care of the patient were reviewed by me and considered in my medical decision making (see chart for details).  abdominal pain. Differential diagnosis includes but is not limited to, abdominal perforation, aortic dissection, cholecystitis, appendicitis, diverticulitis, colitis, esophagitis/gastritis, kidney stone, pyelonephritis, urinary tract infection, Gynecologic. All are considered in decision and treatment plan. Based upon the patient's presentation and risk factors, patient has no evidence of an acute abdomen, no signs or symptoms of appendicitis or cholangitis. Clinical examination suggests likely a genitourinary source, likely BV for vaginosis based on exam. She only has 1 current sexual partner, no cervical motion tenderness. No adnexal tenderness. No symptoms to fit ovarian torsion.   Clinical  Course as of Dec 07 28  Tue Dec 06, 2016  2301 Hemoglobin: (!) 11.3 [MQ]  2301 Potassium: (!) 3.4 [MQ]  2301 Preg Test, Ur: NEGATIVE [MQ]  2301 Reviewed. Bacteria, UA: (!) RARE [MQ]    Clinical Course User Index [MQ] Sharyn Creamer, MD    ----------------------------------------- 12:30 AM on 12/07/2016 -----------------------------------------  Patient reports feeling well, comfortable with plan for discharge. Reports only minimal discomfort suprapubically at this time. Awake alert, well-appearing. Careful abdominal pain return precautions discussed  Return precautions and treatment recommendations and follow-up discussed with the patient who is agreeable with the plan.  ____________________________________________   FINAL CLINICAL IMPRESSION(S) / ED DIAGNOSES  Final diagnoses:  BV (bacterial vaginosis)      NEW MEDICATIONS STARTED DURING THIS VISIT:  New Prescriptions   METRONIDAZOLE (FLAGYL) 500 MG TABLET    Take 1 tablet (500 mg total) by mouth 3 (three) times daily.   ONDANSETRON (ZOFRAN ODT) 4 MG DISINTEGRATING TABLET    Take 1 tablet (4 mg total) by mouth every 6 (six) hours as needed for nausea or vomiting.     Note:  This document was prepared using Dragon voice recognition software and may include unintentional dictation errors.     Sharyn Creamer, MD 12/07/16 [phone]

## 2016-12-06 NOTE — ED Triage Notes (Signed)
Patient ambulatory to triage with steady gait, without difficulty or distress noted; pt reports lower abd pain x 2wks accomp by bloating and pain to lower back with N/V last night

## 2016-12-06 NOTE — ED Notes (Signed)
Pt states that she has sharp abdominal pain all the way across her stomach. Says at first she thought it was period cramps, but its been two weeks and its getting worse and worse.

## 2016-12-07 LAB — CHLAMYDIA/NGC RT PCR (ARMC ONLY)
Chlamydia Tr: NOT DETECTED
N gonorrhoeae: NOT DETECTED

## 2016-12-07 MED ORDER — ONDANSETRON 4 MG PO TBDP: 4.0000 mg | ORAL_TABLET | Freq: Four times a day (QID) | ORAL | 0 refills | Status: DC | PRN

## 2016-12-07 MED ORDER — METRONIDAZOLE 500 MG PO TABS: 500.0000 mg | ORAL_TABLET | Freq: Three times a day (TID) | ORAL | 0 refills | Status: DC

## 2016-12-07 NOTE — Discharge Instructions (Signed)
? ?  Please return to the emergency room right away if you are to develop a fever, severe nausea, your pain becomes severe or worsens, you are unable to keep food down, begin vomiting any dark or bloody fluid, you develop any dark or bloody stools, feel dehydrated, or other new concerns or symptoms arise. ? ?

## 2017-07-10 ENCOUNTER — Ambulatory Visit: Payer: Self-pay | Attending: Oncology | Admitting: *Deleted

## 2017-07-10 ENCOUNTER — Encounter (INDEPENDENT_AMBULATORY_CARE_PROVIDER_SITE_OTHER): Payer: Self-pay

## 2017-07-10 VITALS — BP 95/63 | HR 73 | Temp 98.6°F | Ht <= 58 in | Wt 119.0 lb

## 2017-07-10 DIAGNOSIS — R87612 Low grade squamous intraepithelial lesion on cytologic smear of cervix (LGSIL): Secondary | ICD-10-CM

## 2017-07-11 NOTE — Progress Notes (Signed)
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  Discussed results with patient and hand-out given on "Understanding Pap Smears" and HPV.  Patient states she had the Gardasil vaccine.  She also complains that she has pelvic pain and was seen in the ED a couple of months ago.  States she still has severe pelvic pain, after intercourse and intermittently.  States she had not had a period since getting her IUD.  Will refer patient to GYN for colposcopy and possible biopsy for her abnormal pap of LGSIL.  Encouraged her to discuss her pelvic pain at that time.  Kelly Quailshristy Burton to schedule patient to with [facility] or [facility].  Will follow-up per [facility] protocol.

## 2017-07-21 ENCOUNTER — Encounter: Payer: Self-pay | Admitting: Obstetrics & Gynecology

## 2017-07-21 ENCOUNTER — Ambulatory Visit (INDEPENDENT_AMBULATORY_CARE_PROVIDER_SITE_OTHER): Payer: Self-pay | Admitting: Obstetrics & Gynecology

## 2017-07-21 VITALS — BP 120/80 | HR 68 | Ht <= 58 in | Wt 117.0 lb

## 2017-07-21 DIAGNOSIS — N870 Mild cervical dysplasia: Secondary | ICD-10-CM

## 2017-07-21 DIAGNOSIS — D069 Carcinoma in situ of cervix, unspecified: Secondary | ICD-10-CM

## 2017-07-21 NOTE — Addendum Note (Signed)
Addended by: Nadara Mustard on: 07/21/2017 02:26 PM   Modules accepted: Orders

## 2017-07-21 NOTE — Patient Instructions (Signed)

## 2017-07-21 NOTE — Progress Notes (Signed)
Referring Provider:  [facility]  HPI:  Kelly Roberts is a 27 y.o.  [phone]  who presents today for evaluation and management of abnormal cervical cytology.    Dysplasia History:  LGSIL by recent PAP  ROS:  Pertinent items noted in HPI and remainder of comprehensive ROS otherwise negative.  OB History  Gravida Para Term Preterm AB Living  SAB TAB Ectopic Multiple Live Births               # Outcome Date GA Lbr Len/2nd Weight Sex Delivery Anes PTL Lv  2 Term 06/02/10     Vag-Spont     1 Term             Past Medical History:  Diagnosis Date  . Medical history non-contributory    Past Surgical History:  Procedure Laterality Date  . NO PAST SURGERIES     SOCIAL HISTORY: Social History   Substance and Sexual Activity  Alcohol Use Yes   Comment: occasionally   Social History   Substance and Sexual Activity  Drug Use No   Family History  Problem Relation Age of Onset  . Cancer Paternal Grandmother    ALLERGIES:  Patient has no known allergies.  Current Outpatient Medications on File Prior to Visit  Medication Sig Dispense Refill  . levonorgestrel (MIRENA) 20 MCG/24HR IUD 1 each by Intrauterine route once.    . brompheniramine-pseudoephedrine-DM 30-2-10 MG/5ML syrup Take 5 mLs by mouth 4 (four) times daily as needed. (Patient not taking: Reported on 07/21/2017) 120 mL 0  . ibuprofen (ADVIL,MOTRIN) 600 MG tablet Take 1 tablet (600 mg total) by mouth every 6 (six) hours. (Patient not taking: Reported on 07/21/2017) 30 tablet 0  . ibuprofen (ADVIL,MOTRIN) 600 MG tablet Take 1 tablet (600 mg total) by mouth every 8 (eight) hours as needed. (Patient not taking: Reported on 07/21/2017) 15 tablet 0  . metroNIDAZOLE (FLAGYL) 500 MG tablet Take 1 tablet (500 mg total) by mouth 3 (three) times daily. (Patient not taking: Reported on 07/21/2017) 30 tablet 0  . naproxen (NAPROSYN) 500 MG tablet Take 1 tablet (500 mg total) by mouth 2 (two) times daily with a meal. (Patient not  taking: Reported on 07/21/2017) 20 tablet 0  . ondansetron (ZOFRAN ODT) 4 MG disintegrating tablet Take 1 tablet (4 mg total) by mouth every 6 (six) hours as needed for nausea or vomiting. (Patient not taking: Reported on 07/21/2017) 20 tablet 0  . Prenatal Vit-Fe Fumarate-FA (EQL PRENATAL FORMULA) 28-0.8 MG TABS Take 1 tablet by mouth daily.    . sertraline (ZOLOFT) 50 MG tablet Take 50 mg by mouth daily.    . traMADol (ULTRAM) 50 MG tablet Take 1 tablet (50 mg total) by mouth every 6 (six) hours as needed for moderate pain. (Patient not taking: Reported on 07/21/2017) 12 tablet 0  . zolpidem (AMBIEN) 5 MG tablet Take 1 tablet (5 mg total) by mouth at bedtime as needed for sleep. (Patient not taking: Reported on 07/21/2017) 20 tablet 0   No current facility-administered medications on file prior to visit.    Physical Exam: -Vitals:  BP 120/80   Pulse 68   Ht  (1.422 m)   Wt 117 lb (53.1 kg)   BMI 26.23 kg/m  GEN: WD, WN, NAD.  A+ O x 3, good mood and affect. ABD:  NT, ND.  Soft, no masses.  No hernias noted.   Pelvic:   Vulva:  Normal appearance.  No lesions.  Vagina: No lesions or abnormalities noted.  Support: Normal pelvic support.  Urethra No masses tenderness or scarring.  Meatus Normal size without lesions or prolapse.  Cervix: See below.  Anus: Normal exam.  No lesions.  Perineum: Normal exam.  No lesions.        Bimanual   Uterus: Normal size.  Non-tender.  Mobile.  AV.  Adnexae: No masses.  Non-tender to palpation.  Cul-de-sac: Negative for abnormality.   PROCEDURE: 1.  Urine Pregnancy Test:  not done 2.  Colposcopy performed with 4% acetic acid after verbal consent obtained                                         -Aceto-white Lesions Location(s): none.              -Biopsy performed at 6, 12 o'clock               -ECC indicated and performed: Yes.       -Biopsy sites made hemostatic with pressure, AgNO3, and/or Monsel's solution   -Satisfactory colposcopy: Yes.       -Evidence of Invasive cervical CA :  NO  ASSESSMENT:  Kelly Roberts is a 27 y.o. 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  I discussed the grading system of pap smears and HPV high risk viral types.  We will discuss and base management after colpo results return. 2. Follow up PAP 6 months, vs intervention if high grade dysplasia identified     Annamarie Major, MD, [facility], [facility] 07/21/2017  9:38 AM

## 2017-07-25 LAB — PATHOLOGY

## 2017-07-26 NOTE — Progress Notes (Signed)
Normal biopsy results. PAP 6 mos. D/w pt

## 2017-08-10 ENCOUNTER — Encounter: Payer: Self-pay | Admitting: *Deleted

## 2017-08-10 NOTE — Progress Notes (Signed)
Patient with benign cervical biopsy.  Dr. Tiburcio Pea has recommended a 6 month follow-up pap smear.  I have scheduled her to return to [facility] for next pap on 01/24/18 @ 11:00.  Letter with appointment mailed to patient.  [facility] to [place].

## 2018-01-24 ENCOUNTER — Ambulatory Visit: Payer: Self-pay | Attending: Oncology

## 2018-03-28 ENCOUNTER — Ambulatory Visit: Payer: Self-pay | Attending: Oncology

## 2018-03-28 ENCOUNTER — Encounter (INDEPENDENT_AMBULATORY_CARE_PROVIDER_SITE_OTHER): Payer: Self-pay

## 2018-03-28 VITALS — BP 95/69 | HR 71 | Temp 98.0°F | Ht <= 58 in | Wt 116.0 lb

## 2018-03-28 DIAGNOSIS — Z8742 Personal history of other diseases of the female genital tract: Secondary | ICD-10-CM

## 2018-03-28 DIAGNOSIS — Z0000 Encounter for general adult medical examination without abnormal findings: Secondary | ICD-10-CM

## 2018-03-28 NOTE — Progress Notes (Signed)
  Subjective:     Patient ID: ANDRES MARZILLI, female   DOB: 06-10-1990, 28 y.o.   MRN: [id number]  HPI   Review of Systems     Objective:   Physical Exam Abdominal:     Hernia: There is no hernia in the right inguinal area or left inguinal area.  Genitourinary:    Pubic Area: No rash or pubic lice.      Labia:        Right: No rash, tenderness, lesion or injury.      Vagina: No signs of injury and foreign body. No vaginal discharge, erythema, tenderness, bleeding, lesions or prolapsed vaginal walls.     Cervix: Normal. No eversion.     Uterus: Not deviated, not enlarged, not fixed, not tender and no uterine prolapse.      Adnexa:        Right: No mass, tenderness or fullness.         Left: No mass, tenderness or fullness.    Lymphadenopathy:     Lower Body: No right inguinal adenopathy. No left inguinal adenopathy.        Assessment:     28 year old patient of Dr. Velora Mediate returns for annual [facility] screening and follow-up pap.  Patient screened, and meets BCCCP eligibility.  Patient does not have insurance, Medicare or Medicaid.  Handout given on Affordable Care Act.  Patient has a history of LSIL pap results in April 2019.  ECC in May 2019 revealed benign results.  Pelvic exam normal .  IUD in place x 3 years per patient.    Plan:     Specimen collected for pap.  Will follow per [facility] guidelines , and Dr. Tiburcio Pea' recommendation.

## 2018-04-02 LAB — PAP LB AND HPV HIGH-RISK: HPV, HIGH-RISK: NEGATIVE

## 2018-04-22 ENCOUNTER — Emergency Department: Payer: BLUE CROSS/BLUE SHIELD

## 2018-04-22 ENCOUNTER — Emergency Department
Admission: EM | Admit: 2018-04-22 | Discharge: 2018-04-22 | Disposition: A | Discharge status: Home / Self Care | Payer: BLUE CROSS/BLUE SHIELD | Attending: Emergency Medicine | Admitting: Emergency Medicine

## 2018-04-22 ENCOUNTER — Other Ambulatory Visit: Payer: Self-pay

## 2018-04-22 DIAGNOSIS — Z79899 Other long term (current) drug therapy: Secondary | ICD-10-CM | POA: Insufficient documentation

## 2018-04-22 DIAGNOSIS — M7918 Myalgia, other site: Secondary | ICD-10-CM | POA: Diagnosis present

## 2018-04-22 DIAGNOSIS — F17210 Nicotine dependence, cigarettes, uncomplicated: Secondary | ICD-10-CM | POA: Diagnosis not present

## 2018-04-22 DIAGNOSIS — J40 Bronchitis, not specified as acute or chronic: Secondary | ICD-10-CM | POA: Insufficient documentation

## 2018-04-22 MED ORDER — ALBUTEROL SULFATE HFA 108 (90 BASE) MCG/ACT IN AERS: 2.0000 | INHALATION_SPRAY | RESPIRATORY_TRACT | 0 refills | Status: DC | PRN

## 2018-04-22 MED ORDER — PREDNISONE 50 MG PO TABS: 50.0000 mg | ORAL_TABLET | Freq: Every day | ORAL | 0 refills | Status: DC

## 2018-04-22 NOTE — ED Triage Notes (Signed)
Patient is checking in and brings her toddler with her for same symptoms, of sore throat, ear pain, head pain, chest pain, cough, chills. Is unsure if she has had a fever or not; been busy taking care of her sick child.

## 2018-04-22 NOTE — ED Notes (Signed)
Pt states that nobody checked her vitals except a temperature the whole time that she has been here and refused discharge vitals by this RN for herself and her child.

## 2018-04-22 NOTE — ED Provider Notes (Signed)
[facility] Emergency Department Provider Note  ____________________________________________  Time seen: Approximately 9:23 PM  I have reviewed the triage vital signs and the nursing notes.   HISTORY  Chief Complaint Generalized Body Aches    HPI Kelly Roberts is a 28 y.o. female who presents the emergency department for a week history of body aches, sore throat, bilateral ear pain, headache, cough, chills.  Patient reports that she has been seen 3 different times in the past same week.  She reports that she has had a negative flu, strep test.  Patient states "I came here because I need someone to tell me what is wrong."  Patient has been taking  over-the-counter medications with no relief.  Patient's daughter is experiencing same symptoms as the patient has this time.   Past Medical History:  Diagnosis Date  . Medical history non-contributory     Patient Active Problem List   Diagnosis Date Noted  . Labor and delivery indication for care or intervention 05/26/2015  . Pregnancy 03/05/2015  . Back pain affecting pregnancy 02/24/2015  . First trimester screening 11/20/2014    Past Surgical History:  Procedure Laterality Date  . NO PAST SURGERIES      Prior to Admission medications   Medication Sig Start Date End Date Taking? Authorizing Provider  albuterol (PROVENTIL HFA;VENTOLIN HFA) 108 (90 Base) MCG/ACT inhaler Inhale 2 puffs into the lungs every 4 (four) hours as needed for wheezing or shortness of breath. 04/22/18   Luetta Piazza, Delorise Royals, PA-C  brompheniramine-pseudoephedrine-DM 30-2-10 MG/5ML syrup Take 5 mLs by mouth 4 (four) times daily as needed. Patient not taking: Reported on 07/21/2017 04/25/16   Joni Reining, PA-C  ibuprofen (ADVIL,MOTRIN) 600 MG tablet Take 1 tablet (600 mg total) by mouth every 6 (six) hours. Patient not taking: Reported on 07/21/2017 05/28/15   Christeen Douglas, MD  ibuprofen (ADVIL,MOTRIN) 600 MG tablet Take 1 tablet (600 mg  total) by mouth every 8 (eight) hours as needed. Patient not taking: Reported on 07/21/2017 04/25/16   Joni Reining, PA-C  levonorgestrel [facility]) 20 MCG/24HR IUD 1 each by Intrauterine route once.    [provider]  metroNIDAZOLE (FLAGYL) 500 MG tablet Take 1 tablet (500 mg total) by mouth 3 (three) times daily. Patient not taking: Reported on 07/21/2017 12/07/16   Sharyn Creamer, MD  naproxen (NAPROSYN) 500 MG tablet Take 1 tablet (500 mg total) by mouth 2 (two) times daily with a meal. Patient not taking: Reported on 07/21/2017 05/14/16   Joni Reining, PA-C  ondansetron (ZOFRAN ODT) 4 MG disintegrating tablet Take 1 tablet (4 mg total) by mouth every 6 (six) hours as needed for nausea or vomiting. Patient not taking: Reported on 07/21/2017 12/07/16   Sharyn Creamer, MD  predniSONE (DELTASONE) 50 MG tablet Take 1 tablet (50 mg total) by mouth daily with breakfast. 04/22/18   Gatha Mcnulty, Delorise Royals, PA-C  Prenatal Vit-Fe Fumarate-FA (EQL PRENATAL FORMULA) 28-0.8 MG TABS Take 1 tablet by mouth daily.    [provider]  sertraline (ZOLOFT) 50 MG tablet Take 50 mg by mouth daily.    [provider]  traMADol (ULTRAM) 50 MG tablet Take 1 tablet (50 mg total) by mouth every 6 (six) hours as needed for moderate pain. Patient not taking: Reported on 07/21/2017 05/14/16   Joni Reining, PA-C  zolpidem (AMBIEN) 5 MG tablet Take 1 tablet (5 mg total) by mouth at bedtime as needed for sleep. Patient not taking: Reported on 07/21/2017  04/28/15   Karena AddisonSigmon, Meredith C, CNM    Allergies Patient has no known allergies.  Family History  Problem Relation Age of Onset  . Cancer Paternal Grandmother     Social History Social History   Tobacco Use  . Smoking status: Current Every Day Smoker    Packs/day: 0.25    Types: Cigarettes    Last attempt to quit: 10/08/2014    Years since quitting: 3.5  . Smokeless tobacco: Never Used  Substance Use Topics  . Alcohol use: Yes    Comment: occasionally   . Drug use: No     Review of Systems  Constitutional: Positive fever/chills Eyes: No visual changes. No discharge ENT: Positive for nasal congestion, sore throat, bilateral ear pain. Cardiovascular: no chest pain. Respiratory: Positive cough. No SOB. Gastrointestinal: No abdominal pain.  No nausea, no vomiting.  No diarrhea.  No constipation. Genitourinary: Negative for dysuria. No hematuria Musculoskeletal: Negative for musculoskeletal pain. Skin: Negative for rash, abrasions, lacerations, ecchymosis. Neurological: Negative for headaches, focal weakness or numbness. 10-point ROS otherwise negative.  ____________________________________________   PHYSICAL EXAM:  VITAL SIGNS: ED Triage Vitals  Enc Vitals Group     BP 04/22/18 1955 113/68     Pulse Rate 04/22/18 1955 (!) 105     Resp 04/22/18 1955 18     Temp 04/22/18 1955 99.4 F (37.4 C)     Temp Source 04/22/18 1955 Oral     SpO2 04/22/18 1955 97 %     Weight 04/22/18 1956 115 lb (52.2 kg)     Height 04/22/18 1956 4\' 9"  (1.448 m)     Head Circumference --      Peak Flow --      Pain Score 04/22/18 1956 5     Pain Loc --      Pain Edu? --      Excl. in GC? --      Constitutional: Alert and oriented. Well appearing and in no acute distress. Eyes: Conjunctivae are normal. PERRL. EOMI. Head: Atraumatic. ENT:      Ears: EACs unremarkable bilaterally.  TMs are unremarkable bilaterally.      Nose: Mild to moderate clear congestion/rhinnorhea.      Mouth/Throat: Mucous membranes are moist.  Oropharynx is minimally erythematous but nonedematous.  Tonsils are unremarkable bilaterally.  Uvula is midline. Neck: No stridor.  Neck is supple full range of motion Hematological/Lymphatic/Immunilogical: No cervical lymphadenopathy. Cardiovascular: Normal rate, regular rhythm. Normal S1 and S2.  Good peripheral circulation. Respiratory: Normal respiratory effort without tachypnea or retractions. Lungs CTAB. Good air entry to the  bases with no decreased or absent breath sounds. Musculoskeletal: Full range of motion to all extremities. No gross deformities appreciated. Neurologic:  Normal speech and language. No gross focal neurologic deficits are appreciated.  Skin:  Skin is warm, dry and intact. No rash noted. Psychiatric: Mood and affect are normal. Speech and behavior are normal. Patient exhibits appropriate insight and judgement.   ____________________________________________   LABS (all labs ordered are listed, but only abnormal results are displayed)  Labs Reviewed - No data to display ____________________________________________  EKG   ____________________________________________  RADIOLOGY I personally viewed and evaluated these images as part of my medical decision making, as well as reviewing the written report by the radiologist.  I concur with radiologist finding  Dg Chest 2 View  Result Date: 04/22/2018 CLINICAL DATA:  Cough, fever, body aches for 1 week.  Sick contacts. EXAM: CHEST - 2 VIEW COMPARISON:  Chest radiograph  December 24, 2014 FINDINGS: Cardiomediastinal silhouette is normal. Mild bronchitic changes with strandy densities RIGHT upper lobe, no focal consolidation. No pleural effusion. No pneumothorax. Soft tissue planes and included osseous structures are normal. IMPRESSION: 1. Bronchitic changes with RIGHT upper lobe atelectasis, less likely pneumonia. Electronically Signed   By: Awilda Metroourtnay  Bloomer M.D.   On: 04/22/2018 22:25    ____________________________________________    PROCEDURES  Procedure(s) performed:    Procedures    Medications - No data to display   ____________________________________________   INITIAL IMPRESSION / ASSESSMENT AND PLAN / ED COURSE  Pertinent labs & imaging results that were available during my care of the patient were reviewed by me and considered in my medical decision making (see chart for details).  Review of the [place] CSRS was performed in  accordance of the [facility] prior to dispensing any controlled drugs.      Patient's diagnosis is consistent with bronchitis.  Patient presents the emergency department multiple viral URI symptoms.  Overall, exam is reassuring.  Chest x-ray revealed signs consistent with bronchitis.  Patient was treated with prednisone and albuterol for same.  Tylenol Motrin at home.  Plenty of fluids and rest.  Follow-up primary care as needed.. Patient is given ED precautions to return to the ED for any worsening or new symptoms.     ____________________________________________  FINAL CLINICAL IMPRESSION(S) / ED DIAGNOSES  Final diagnoses:  Bronchitis      NEW MEDICATIONS STARTED DURING THIS VISIT:  ED Discharge Orders         Ordered    predniSONE (DELTASONE) 50 MG tablet  Daily with breakfast     04/22/18 2230    albuterol (PROVENTIL HFA;VENTOLIN HFA) 108 (90 Base) MCG/ACT inhaler  Every 4 hours PRN     04/22/18 2230              This chart was dictated using voice recognition software/Dragon. Despite best efforts to proofread, errors can occur which can change the meaning. Any change was purely unintentional.    Racheal PatchesCuthriell, Travonne Schowalter D, PA-C 04/22/18 2231    Rockne MenghiniNorman, Anne-Caroline, MD 04/23/18 [id number]

## 2018-05-23 NOTE — Progress Notes (Signed)
Phoned patient with pap results of ASCUS/ HPV negative.  Per Dr. Tiburcio Pea , patient to return to [facility] in 6 months for follow-up pap.  Scheduled for 11/21/2018 at 2:30.  Appointment reminder mailed to patient.  Patient reports she was billed for pap.  She is to bring bill to clinic to be paid through [facility].  Copy to [facility].

## 2018-11-06 ENCOUNTER — Other Ambulatory Visit: Payer: Self-pay

## 2018-11-06 DIAGNOSIS — Z20822 Contact with and (suspected) exposure to covid-19: Secondary | ICD-10-CM

## 2018-11-07 LAB — NOVEL CORONAVIRUS, NAA: SARS-CoV-2, NAA: NOT DETECTED

## 2018-11-21 ENCOUNTER — Ambulatory Visit: Payer: BC Managed Care – PPO | Attending: Oncology

## 2018-11-24 ENCOUNTER — Other Ambulatory Visit: Payer: Self-pay

## 2018-11-24 ENCOUNTER — Encounter: Payer: Self-pay | Admitting: Emergency Medicine

## 2018-11-24 ENCOUNTER — Emergency Department
Admission: EM | Admit: 2018-11-24 | Discharge: 2018-11-24 | Disposition: A | Discharge status: Home / Self Care | Payer: BC Managed Care – PPO | Attending: Emergency Medicine | Admitting: Emergency Medicine

## 2018-11-24 DIAGNOSIS — Z5321 Procedure and treatment not carried out due to patient leaving prior to being seen by health care provider: Secondary | ICD-10-CM | POA: Insufficient documentation

## 2018-11-24 DIAGNOSIS — R197 Diarrhea, unspecified: Secondary | ICD-10-CM | POA: Diagnosis present

## 2018-11-24 LAB — URINALYSIS, COMPLETE (UACMP) WITH MICROSCOPIC
Bilirubin Urine: NEGATIVE
Glucose, UA: NEGATIVE mg/dL
Hgb urine dipstick: NEGATIVE
Ketones, ur: NEGATIVE mg/dL
Leukocytes,Ua: NEGATIVE
Nitrite: NEGATIVE
Protein, ur: 30 mg/dL — AB
Specific Gravity, Urine: 1.031 — ABNORMAL HIGH (ref 1.005–1.030)
pH: 6 (ref 5.0–8.0)

## 2018-11-24 LAB — CBC
HCT: 36.4 % (ref 36.0–46.0)
Hemoglobin: 12.4 g/dL (ref 12.0–15.0)
MCH: 31.7 pg (ref 26.0–34.0)
MCHC: 34.1 g/dL (ref 30.0–36.0)
MCV: 93.1 fL (ref 80.0–100.0)
Platelets: 225 10*3/uL (ref 150–400)
RBC: 3.91 MIL/uL (ref 3.87–5.11)
RDW: 11.9 % (ref 11.5–15.5)
WBC: 5.9 10*3/uL (ref 4.0–10.5)
nRBC: 0 % (ref 0.0–0.2)

## 2018-11-24 LAB — COMPREHENSIVE METABOLIC PANEL
ALT: 8 U/L (ref 0–44)
AST: 20 U/L (ref 15–41)
Albumin: 4.3 g/dL (ref 3.5–5.0)
Alkaline Phosphatase: 52 U/L (ref 38–126)
Anion gap: 12 (ref 5–15)
BUN: 15 mg/dL (ref 6–20)
CO2: 24 mmol/L (ref 22–32)
Calcium: 9.3 mg/dL (ref 8.9–10.3)
Chloride: 104 mmol/L (ref 98–111)
Creatinine, Ser: 0.71 mg/dL (ref 0.44–1.00)
GFR calc Af Amer: >60 mL/min (ref >60)
GFR calc non Af Amer: >60 mL/min (ref >60)
Glucose, Bld: 141 mg/dL — ABNORMAL HIGH (ref 70–99)
Potassium: 3.1 mmol/L — ABNORMAL LOW (ref 3.5–5.1)
Sodium: 140 mmol/L (ref 135–145)
Total Bilirubin: 0.8 mg/dL (ref 0.3–1.2)
Total Protein: 7.8 g/dL (ref 6.5–8.1)

## 2018-11-24 LAB — POCT PREGNANCY, URINE: Preg Test, Ur: NEGATIVE

## 2018-11-24 LAB — LIPASE, BLOOD: Lipase: 26 U/L (ref 11–51)

## 2018-11-24 MED ORDER — SODIUM CHLORIDE 0.9% FLUSH: 3.0000 mL | Freq: Once | INTRAVENOUS | Status: DC

## 2018-11-24 NOTE — ED Notes (Signed)
Recheck, not in waiting room.

## 2018-11-24 NOTE — ED Notes (Signed)
Call for room, not in waiting room.  

## 2018-11-24 NOTE — ED Triage Notes (Signed)
Pt to ED via POV, pt states that she has had headache x 3 days and diarrhea yesterday. Pt states that she had 5 or 6 episodes of diarrhea. Pt states that this morning she started feeling nauseated. Pt is in NAD at this time. VS WNL.

## 2018-11-24 NOTE — ED Notes (Signed)
Called for room, not in waiting room.  

## 2018-11-24 NOTE — ED Notes (Signed)
Pt given urine cup, unable to provide sample at this time. 

## 2018-11-24 NOTE — ED Notes (Signed)
Pt to front desk to inquire about wait time. Pt informed that she still has 3 people who have been here longer than her.

## 2019-02-17 ENCOUNTER — Emergency Department
Admission: EM | Admit: 2019-02-17 | Discharge: 2019-02-17 | Disposition: A | Discharge status: Home / Self Care | Payer: BC Managed Care – PPO | Attending: Emergency Medicine | Admitting: Emergency Medicine

## 2019-02-17 ENCOUNTER — Emergency Department: Payer: BC Managed Care – PPO

## 2019-02-17 ENCOUNTER — Encounter: Payer: Self-pay | Admitting: Intensive Care

## 2019-02-17 ENCOUNTER — Other Ambulatory Visit: Payer: Self-pay

## 2019-02-17 DIAGNOSIS — R110 Nausea: Secondary | ICD-10-CM | POA: Diagnosis not present

## 2019-02-17 DIAGNOSIS — Z20822 Contact with and (suspected) exposure to covid-19: Secondary | ICD-10-CM

## 2019-02-17 DIAGNOSIS — F17210 Nicotine dependence, cigarettes, uncomplicated: Secondary | ICD-10-CM | POA: Insufficient documentation

## 2019-02-17 DIAGNOSIS — Z20828 Contact with and (suspected) exposure to other viral communicable diseases: Secondary | ICD-10-CM | POA: Insufficient documentation

## 2019-02-17 DIAGNOSIS — Z79899 Other long term (current) drug therapy: Secondary | ICD-10-CM | POA: Diagnosis not present

## 2019-02-17 DIAGNOSIS — R0602 Shortness of breath: Secondary | ICD-10-CM | POA: Diagnosis present

## 2019-02-17 LAB — URINALYSIS, COMPLETE (UACMP) WITH MICROSCOPIC
Bilirubin Urine: NEGATIVE
Glucose, UA: NEGATIVE mg/dL
Hgb urine dipstick: NEGATIVE
Ketones, ur: NEGATIVE mg/dL
Leukocytes,Ua: NEGATIVE
Nitrite: POSITIVE — AB
Protein, ur: NEGATIVE mg/dL
Specific Gravity, Urine: 1.023 (ref 1.005–1.030)
pH: 6 (ref 5.0–8.0)

## 2019-02-17 LAB — CBC WITH DIFFERENTIAL/PLATELET
Abs Immature Granulocytes: 0.02 10*3/uL (ref 0.00–0.07)
Basophils Absolute: 0 10*3/uL (ref 0.0–0.1)
Basophils Relative: 0 %
Eosinophils Absolute: 0.1 10*3/uL (ref 0.0–0.5)
Eosinophils Relative: 2 %
HCT: 33.2 % — ABNORMAL LOW (ref 36.0–46.0)
Hemoglobin: 11.7 g/dL — ABNORMAL LOW (ref 12.0–15.0)
Immature Granulocytes: 0 %
Lymphocytes Relative: 40 %
Lymphs Abs: 2.3 10*3/uL (ref 0.7–4.0)
MCH: 31.5 pg (ref 26.0–34.0)
MCHC: 35.2 g/dL (ref 30.0–36.0)
MCV: 89.5 fL (ref 80.0–100.0)
Monocytes Absolute: 0.4 10*3/uL (ref 0.1–1.0)
Monocytes Relative: 7 %
Neutro Abs: 2.8 10*3/uL (ref 1.7–7.7)
Neutrophils Relative %: 51 %
Platelets: 184 10*3/uL (ref 150–400)
RBC: 3.71 MIL/uL — ABNORMAL LOW (ref 3.87–5.11)
RDW: 11.8 % (ref 11.5–15.5)
WBC: 5.7 10*3/uL (ref 4.0–10.5)
nRBC: 0 % (ref 0.0–0.2)

## 2019-02-17 LAB — COMPREHENSIVE METABOLIC PANEL
ALT: 8 U/L (ref 0–44)
AST: 17 U/L (ref 15–41)
Albumin: 4.1 g/dL (ref 3.5–5.0)
Alkaline Phosphatase: 61 U/L (ref 38–126)
Anion gap: 11 (ref 5–15)
BUN: 13 mg/dL (ref 6–20)
CO2: 23 mmol/L (ref 22–32)
Calcium: 8.8 mg/dL — ABNORMAL LOW (ref 8.9–10.3)
Chloride: 105 mmol/L (ref 98–111)
Creatinine, Ser: 0.51 mg/dL (ref 0.44–1.00)
GFR calc Af Amer: >60 mL/min (ref >60)
GFR calc non Af Amer: >60 mL/min (ref >60)
Glucose, Bld: 95 mg/dL (ref 70–99)
Potassium: 3.6 mmol/L (ref 3.5–5.1)
Sodium: 139 mmol/L (ref 135–145)
Total Bilirubin: 0.5 mg/dL (ref 0.3–1.2)
Total Protein: 7.2 g/dL (ref 6.5–8.1)

## 2019-02-17 LAB — POCT PREGNANCY, URINE: Preg Test, Ur: NEGATIVE

## 2019-02-17 LAB — POC SARS CORONAVIRUS 2 AG: SARS Coronavirus 2 Ag: NEGATIVE

## 2019-02-17 MED ORDER — IOHEXOL 350 MG/ML SOLN
75.0000 mL | Freq: Once | INTRAVENOUS | Status: AC | PRN
  Administered 2019-02-17: 75 mL via INTRAVENOUS
  Filled 2019-02-17: qty 75

## 2019-02-17 NOTE — ED Provider Notes (Signed)
[facility] Emergency Department Provider Note  ____________________________________________   First MD Initiated Contact with Patient 02/17/19 1302     (approximate)  I have reviewed the triage vital signs and the nursing notes.   HISTORY  Chief Complaint Shortness of Breath and Nausea    HPI Kelly Roberts is a 28 y.o. female presents emergency department complaining of some shortness of breath with nausea for 1 week.  States that she gets very short of breath when she climbs steps.  States she will have to double over to catch her breath and does have some chest pain at this time.  She denies any known fever or chills.  No history of cardiac problems.  She is also had some nausea.  Unsure of Covid exposure.  Symptoms for 1 week.    Past Medical History:  Diagnosis Date  . Medical history non-contributory     Patient Active Problem List   Diagnosis Date Noted  . Labor and delivery indication for care or intervention 05/26/2015  . Pregnancy 03/05/2015  . Back pain affecting pregnancy 02/24/2015  . First trimester screening 11/20/2014    Past Surgical History:  Procedure Laterality Date  . NO PAST SURGERIES      Prior to Admission medications   Medication Sig Start Date End Date Taking? Authorizing Provider  albuterol (PROVENTIL HFA;VENTOLIN HFA) 108 (90 Base) MCG/ACT inhaler Inhale 2 puffs into the lungs every 4 (four) hours as needed for wheezing or shortness of breath. 04/22/18   Cuthriell, Delorise Royals, PA-C  levonorgestrel (MIRENA) 20 MCG/24HR IUD 1 each by Intrauterine route once.    [provider]  predniSONE (DELTASONE) 50 MG tablet Take 1 tablet (50 mg total) by mouth daily with breakfast. 04/22/18   Cuthriell, Delorise Royals, PA-C  Prenatal Vit-Fe Fumarate-FA (EQL PRENATAL FORMULA) 28-0.8 MG TABS Take 1 tablet by mouth daily.    [provider]  sertraline (ZOLOFT) 50 MG tablet Take 50 mg by mouth daily.    [provider]  zolpidem (AMBIEN) 5 MG tablet Take 1 tablet (5 mg total) by mouth at bedtime as needed for sleep. Patient not taking: Reported on 07/21/2017 04/28/15 02/17/19  Karena Addison, CNM    Allergies Patient has no known allergies.  Family History  Problem Relation Age of Onset  . Cancer Paternal Grandmother     Social History Social History   Tobacco Use  . Smoking status: Current Every Day Smoker    Packs/day: 0.25    Types: Cigarettes    Last attempt to quit: 10/08/2014    Years since quitting: 4.3  . Smokeless tobacco: Never Used  Substance Use Topics  . Alcohol use: Yes  . Drug use: No    Review of Systems  Constitutional: No fever/chills Eyes: No visual changes. ENT: No sore throat. Respiratory: Denies cough, positive shortness of breath Genitourinary: Negative for dysuria. Musculoskeletal: Negative for back pain. Skin: Negative for rash.    ____________________________________________   PHYSICAL EXAM:  VITAL SIGNS: ED Triage Vitals  Enc Vitals Group     BP 02/17/19 1224 (!) 136/94     Pulse Rate 02/17/19 1224 78     Resp 02/17/19 1224 16     Temp 02/17/19 1224 98.8 F (37.1 C)     Temp Source 02/17/19 1224 Oral     SpO2 02/17/19 1224 100 %     Weight 02/17/19 1225 130 lb (59 kg)     Height 02/17/19 1225 4\' 8"  (  1.422 m)     Head Circumference --      Peak Flow --      Pain Score 02/17/19 1225 0     Pain Loc --      Pain Edu? --      Excl. in GC? --     Constitutional: Alert and oriented. Well appearing and in no acute distress. Eyes: Conjunctivae are normal.  Head: Atraumatic. Nose: No congestion/rhinnorhea. Mouth/Throat: Mucous membranes are moist.   Neck:  supple no lymphadenopathy noted Cardiovascular: Normal rate, regular rhythm. Heart sounds are normal Respiratory: Normal respiratory effort.  No retractions, lungs c t a  Abd: soft nontender bs normal all 4 quad GU: deferred Musculoskeletal: FROM all extremities, warm and well perfused  Neurologic:  Normal speech and language.  Skin:  Skin is warm, dry and intact. No rash noted. Psychiatric: Mood and affect are normal. Speech and behavior are normal.  ____________________________________________   LABS (all labs ordered are listed, but only abnormal results are displayed)  Labs Reviewed  CBC WITH DIFFERENTIAL/PLATELET - Abnormal; Notable for the following components:      Result Value   RBC 3.71 (*)    Hemoglobin 11.7 (*)    HCT 33.2 (*)    All other components within normal limits  COMPREHENSIVE METABOLIC PANEL - Abnormal; Notable for the following components:   Calcium 8.8 (*)    All other components within normal limits  URINALYSIS, COMPLETE (UACMP) WITH MICROSCOPIC - Abnormal; Notable for the following components:   Color, Urine YELLOW (*)    APPearance HAZY (*)    Nitrite POSITIVE (*)    Bacteria, UA RARE (*)    All other components within normal limits  NOVEL CORONAVIRUS, NAA (HOSP ORDER, SEND-OUT TO REF LAB; TAT 18-24 HRS)  URINE CULTURE  POC SARS CORONAVIRUS 2 AG -  ED  POC URINE PREG, ED  POC SARS CORONAVIRUS 2 AG  POCT PREGNANCY, URINE   ____________________________________________   ____________________________________________  RADIOLOGY  Chest x-ray is normal CTA of the chest is negative  ____________________________________________   PROCEDURES  Procedure(s) performed: No  Procedures    ____________________________________________   INITIAL IMPRESSION / ASSESSMENT AND PLAN / ED COURSE  Pertinent labs & imaging results that were available during my care of the patient were reviewed by me and considered in my medical decision making (see chart for details).   Patient is 28 year old female presents emergency department with complaints of shortness of breath.  Positive birth control.  Physical exam patient appears well.  Chest x-ray is negative CTA of the chest is negative CBC has slightly decreased H&H, comprehensive  metabolic panel is normal, urinalysis is negative, rapid Covid antigen is negative, POC pregnancy is negative  Explained all findings to the patient.  A send out Covid test was ordered.  She is to quarantine until results have been called.  Work note was provided for her.  She was discharged in stable condition.   Kelly Roberts was evaluated in Emergency Department on 02/17/2019 for the symptoms described in the history of present illness. She was evaluated in the context of the global COVID-19 pandemic, which necessitated consideration that the patient might be at risk for infection with the SARS-CoV-2 virus that causes COVID-19. Institutional protocols and algorithms that pertain to the evaluation of patients at risk for COVID-19 are in a state of rapid change based on information released by regulatory bodies including the CDC and federal and state organizations. These policies and algorithms were  followed during the patient's care in the ED.   As part of my medical decision making, I reviewed the following data within the [place] notes reviewed and incorporated, Labs reviewed , Old chart reviewed, Radiograph reviewed , Notes from prior ED visits and [place] Controlled Substance Database  ____________________________________________   FINAL CLINICAL IMPRESSION(S) / ED DIAGNOSES  Final diagnoses:  Suspected COVID-19 virus infection      NEW MEDICATIONS STARTED DURING THIS VISIT:  Discharge Medication List as of 02/17/2019  3:27 PM       Note:  This document was prepared using Dragon voice recognition software and may include unintentional dictation errors.    Versie Starks, PA-C 02/17/19 1643    Vanessa Eastover, MD 02/18/19 [phone]

## 2019-02-17 NOTE — ED Notes (Signed)

## 2019-02-17 NOTE — ED Notes (Signed)
Pt in CT scan.

## 2019-02-17 NOTE — Discharge Instructions (Addendum)
Follow-up with your regular doctor if not better in 3 days.  Return emergency department worsening.  If your Covid test is positive you should quarantine for an additional 10 days.

## 2019-02-17 NOTE — ED Triage Notes (Addendum)
Patient c/o SOB with nausea X 1week. Denies other symptoms. Denies pain. Unlabored breathing in triage. No trouble with breathing after ambulating.

## 2019-02-19 LAB — URINE CULTURE: Culture: >=100000 — AB

## 2019-02-19 LAB — NOVEL CORONAVIRUS, NAA (HOSP ORDER, SEND-OUT TO REF LAB; TAT 18-24 HRS): SARS-CoV-2, NAA: NOT DETECTED

## 2019-02-20 NOTE — Progress Notes (Signed)
Brief Pharmacy Note  Patient is a 28 y/o F who presented to [facility] ED 11/29 c/o shortness of breath and nausea x 1 week. CXR and CTA negative. COVID-19 antigen test and NAAT negative. POC pregnancy test negative. UA with rare bacteria and 0-5 WBC. ROS negative for dysuria and back pain. No fevers or chills. No leukocytosis. She was ultimately discharged with instructions to seek medical attention if persistent or worsening symptoms.   Urine culture has resulted >100k colonies/mL E coli (ampicillin resistant). This finding reflects asymptomatic bacteriuria. Spoke with EDP regarding results and plan to defer treatment at this time.   [place] Resident 20 February 2019

## 2019-02-21 ENCOUNTER — Other Ambulatory Visit: Payer: Self-pay

## 2019-02-21 DIAGNOSIS — Z20822 Contact with and (suspected) exposure to covid-19: Secondary | ICD-10-CM

## 2019-02-23 LAB — NOVEL CORONAVIRUS, NAA: SARS-CoV-2, NAA: NOT DETECTED

## 2020-07-08 ENCOUNTER — Other Ambulatory Visit: Payer: Self-pay

## 2020-07-08 ENCOUNTER — Ambulatory Visit (LOCAL_COMMUNITY_HEALTH_CENTER): Payer: Self-pay | Admitting: Advanced Practice Midwife

## 2020-07-08 ENCOUNTER — Encounter: Payer: Self-pay | Admitting: Advanced Practice Midwife

## 2020-07-08 VITALS — BP 115/77 | Ht <= 58 in | Wt 137.4 lb

## 2020-07-08 DIAGNOSIS — R87612 Low grade squamous intraepithelial lesion on cytologic smear of cervix (LGSIL): Secondary | ICD-10-CM

## 2020-07-08 DIAGNOSIS — R87619 Unspecified abnormal cytological findings in specimens from cervix uteri: Secondary | ICD-10-CM | POA: Insufficient documentation

## 2020-07-08 DIAGNOSIS — Z3009 Encounter for other general counseling and advice on contraception: Secondary | ICD-10-CM

## 2020-07-08 DIAGNOSIS — Z3049 Encounter for surveillance of other contraceptives: Secondary | ICD-10-CM

## 2020-07-08 DIAGNOSIS — J45909 Unspecified asthma, uncomplicated: Secondary | ICD-10-CM

## 2020-07-08 DIAGNOSIS — F419 Anxiety disorder, unspecified: Secondary | ICD-10-CM

## 2020-07-08 DIAGNOSIS — E663 Overweight: Secondary | ICD-10-CM

## 2020-07-08 LAB — WET PREP FOR TRICH, YEAST, CLUE
Trichomonas Exam: NEGATIVE
Yeast Exam: NEGATIVE

## 2020-07-08 LAB — HM HIV SCREENING LAB: HM HIV Screening: NEGATIVE

## 2020-07-08 LAB — HEMOGLOBIN, FINGERSTICK: Hemoglobin: 12.2 g/dL (ref 11.1–15.9)

## 2020-07-08 MED ORDER — METRONIDAZOLE 500 MG PO TABS: 500.0000 mg | ORAL_TABLET | Freq: Two times a day (BID) | ORAL | 0 refills | Status: AC

## 2020-07-08 NOTE — Progress Notes (Signed)
[facility] DEPARTMENT [facility] [address] Main Number: [phone]    Family Planning Visit- Initial Visit  Subjective:  Kelly Roberts is a 30 y.o. SBF exsmoker G2P2002 (10,5)  being seen today for an initial annual visit and to discuss contraceptive options.  The patient is currently using IUD Mirena for pregnancy prevention. Patient reports she does not want a pregnancy in the next year.  Patient has the following medical conditions has Abnormal Pap smear of cervix 06/16/17 LGSIL; Asthma; Overweight BMI=28.7; and Anxiety on their problem list.  Chief Complaint  Patient presents with  . Gynecologic Exam    Patient reports needs physical and pap.  LMP 07/05/20.  Last PE 06/16/17.  Last sex 06/14/20 without condom; with current partner x 4 mo; 1 partner in last 3 mo; 2 partners in last year.  Last cig 01/2019.  Last MJ age 14. Last vaped 06/12/20. Last ETOH 06/12/20 (3 shots liquor)--"I quit". Not working and not in school.  Living with her 2 kids.  Mirena inserted 03/28/16. Pap 06/16/17 LGSIL. colpo 07/21/17 with Dr. Tiburcio Pea with bx x2, ECC: needs pap in 6 mo at [facility]. 03/28/18 [facility] HPV neg: needs pap at [facility] 11/21/18--DNKA.  Patient denies cigars  Body mass index is 28.72 kg/m. - Patient is eligible for diabetes screening based on BMI and age >80?  not applicable HA1C ordered? not applicable  Patient reports 2  partner/s in last year. Desires STI screening?  Yes  Has patient been screened once for HCV in the past?  No  No results found for: HCVAB  Does the patient have current drug use (including MJ), have a partner with drug use, and/or has been incarcerated since last result? No  If yes-- Screen for HCV through [facility]   Does the patient meet criteria for HBV testing? No  Criteria:  -Household, sexual or needle sharing contact with HBV -History of drug use -HIV positive -Those with known Hep C   Health Maintenance Due  Topic Date Due   . Hepatitis C Screening  Never done  . COVID-19 Vaccine (1) Never done  . HIV Screening  Never done    Review of Systems  All other systems reviewed and are negative.   The following portions of the patient's history were reviewed and updated as appropriate: allergies, current medications, past family history, past medical history, past social history, past surgical history and problem list. Problem list updated.   See flowsheet for other program required questions.  Objective:   Vitals:   07/08/20 1014  BP: 115/77  Weight: 137 lb 6.4 oz (62.3 kg)  Height: 4\' 10"  (1.473 m)    Physical Exam Constitutional:      Appearance: Normal appearance. She is normal weight.  HENT:     Head: Normocephalic and atraumatic.     Comments: Thyroid without masses or enlargement Negative cervical lymphadenopathy    Mouth/Throat:     Mouth: Mucous membranes are moist.     Comments: Last dental exam 1 year ago; no visible caries Eyes:     Conjunctiva/sclera: Conjunctivae normal.  Cardiovascular:     Rate and Rhythm: Normal rate and regular rhythm.  Pulmonary:     Effort: Pulmonary effort is normal.     Breath sounds: Normal breath sounds.  Chest:  Breasts:     Right: Normal.     Left: Normal.    Abdominal:     Palpations: Abdomen is soft.  Comments: Soft without masses or tenderness; poor tone  Genitourinary:    General: Normal vulva.     Exam position: Lithotomy position.     Vagina: Vaginal discharge (grey malodorous leukorrhea, ph>4.5) present.     Cervix: Friability (friable to pap) present.     Uterus: Normal.      Adnexa: Right adnexa normal and left adnexa normal.     Rectum: Normal.     Comments: mirena IUD seen Musculoskeletal:        General: Normal range of motion.     Cervical back: Normal range of motion and neck supple.  Skin:    General: Skin is warm and dry.  Neurological:     Mental Status: She is alert.  Psychiatric:        Mood and Affect: Mood  normal.       Assessment and Plan:  Kelly Roberts is a 30 y.o. female presenting to the [facility] Department for an initial annual wellness/contraceptive visit  Contraception counseling: Reviewed all forms of birth control options in the tiered based approach. available including abstinence; over the counter/barrier methods; hormonal contraceptive medication including pill, patch, ring, injection,contraceptive implant, ECP; hormonal and nonhormonal IUDs; permanent sterilization options including vasectomy and the various tubal sterilization modalities. Risks, benefits, and typical effectiveness rates were reviewed.  Questions were answered.  Written information was also given to the patient to review.  Patient desires continuation of Mirena, this was prescribed for patient. She will follow up in  prn for surveillance.  She was told to call with any further questions, or with any concerns about this method of contraception.  Emphasized use of condoms 100% of the time for STI prevention.  Patient was offered ECP. ECP was not accepted by the patient. ECP counseling was not given - see RN documentation  1. Low grade squamous intraepithelial lesion on cytologic smear of cervix (LGSIL) 06/16/17 colpo 07/21/17 Dr. Tiburcio Pea with bx x2 and ECC--needed pap in 6 mo at [facility] 03/28/18 ASCUS HPV neg--per Dr. Tiburcio Pea needs pap in 6 mo 11/21/2018 [facility] Pap done today   2. Uncomplicated asthma, unspecified asthma severity, unspecified whether persistent   3. Family planning Treat BV per standing orders Immunization nurse consult - WET PREP FOR TRICH, YEAST, CLUE - Hemoglobin, venipuncture - Syphilis Serology, [facility] - Chlamydia/Gonorrhea [facility] - HIV [place] [facility] - IGP, rfx Aptima HPV ASCU  4. Encounter for surveillance of other contraceptive Happy with Mirena  5. Overweight BMI=28.7   6. Anxiety Declines counseling     No follow-ups on file.  No future  appointments.  Alberteen Spindle, CNM

## 2020-07-08 NOTE — Progress Notes (Signed)
Here today for PE and "Pap Smear." Last PE here was 06/16/2017 (ASCUS) referred to [facility] then to [facility]. Notes in chart. Mirena IUD placed here 03/28/2016. Wants all STD screening today. Declines condoms. Tawny Hopping, RN

## 2020-07-08 NOTE — Progress Notes (Signed)
Hgb 12.2. [employer] results reviewed by provider Hazle Coca, CNM. Patient treated for BV per provider orders. Tawny Hopping, RN

## 2020-07-17 LAB — IGP, RFX APTIMA HPV ASCU: PAP Smear Comment: 0

## 2020-07-17 LAB — HPV APTIMA: HPV Aptima: NEGATIVE

## 2020-08-20 ENCOUNTER — Emergency Department
Admission: EM | Admit: 2020-08-20 | Discharge: 2020-08-20 | Disposition: A | Discharge status: Home / Self Care | Payer: Self-pay | Attending: Emergency Medicine | Admitting: Emergency Medicine

## 2020-08-20 ENCOUNTER — Ambulatory Visit
Admission: RE | Admit: 2020-08-20 | Discharge: 2020-08-20 | Disposition: A | Discharge status: Home / Self Care | Payer: Self-pay | Source: Ambulatory Visit | Attending: Emergency Medicine | Admitting: Emergency Medicine

## 2020-08-20 ENCOUNTER — Other Ambulatory Visit: Payer: Self-pay

## 2020-08-20 VITALS — BP 109/76 | HR 83 | Temp 98.7°F | Resp 18

## 2020-08-20 DIAGNOSIS — Z5321 Procedure and treatment not carried out due to patient leaving prior to being seen by health care provider: Secondary | ICD-10-CM | POA: Insufficient documentation

## 2020-08-20 DIAGNOSIS — R1031 Right lower quadrant pain: Secondary | ICD-10-CM

## 2020-08-20 LAB — COMPREHENSIVE METABOLIC PANEL
ALT: 8 U/L (ref 0–44)
AST: 14 U/L — ABNORMAL LOW (ref 15–41)
Albumin: 4.4 g/dL (ref 3.5–5.0)
Alkaline Phosphatase: 54 U/L (ref 38–126)
Anion gap: 10 (ref 5–15)
BUN: 10 mg/dL (ref 6–20)
CO2: 22 mmol/L (ref 22–32)
Calcium: 9.2 mg/dL (ref 8.9–10.3)
Chloride: 104 mmol/L (ref 98–111)
Creatinine, Ser: 0.65 mg/dL (ref 0.44–1.00)
GFR, Estimated: >60 mL/min (ref >60)
Glucose, Bld: 108 mg/dL — ABNORMAL HIGH (ref 70–99)
Potassium: 4 mmol/L (ref 3.5–5.1)
Sodium: 136 mmol/L (ref 135–145)
Total Bilirubin: 0.6 mg/dL (ref 0.3–1.2)
Total Protein: 8.1 g/dL (ref 6.5–8.1)

## 2020-08-20 LAB — CBC
HCT: 32 % — ABNORMAL LOW (ref 36.0–46.0)
Hemoglobin: 11 g/dL — ABNORMAL LOW (ref 12.0–15.0)
MCH: 31.2 pg (ref 26.0–34.0)
MCHC: 34.4 g/dL (ref 30.0–36.0)
MCV: 90.7 fL (ref 80.0–100.0)
Platelets: 210 10*3/uL (ref 150–400)
RBC: 3.53 MIL/uL — ABNORMAL LOW (ref 3.87–5.11)
RDW: 12.1 % (ref 11.5–15.5)
WBC: 6.8 10*3/uL (ref 4.0–10.5)
nRBC: 0 % (ref 0.0–0.2)

## 2020-08-20 LAB — URINALYSIS, COMPLETE (UACMP) WITH MICROSCOPIC
Bilirubin Urine: NEGATIVE
Glucose, UA: NEGATIVE mg/dL
Hgb urine dipstick: NEGATIVE
Ketones, ur: NEGATIVE mg/dL
Leukocytes,Ua: NEGATIVE
Nitrite: NEGATIVE
Protein, ur: NEGATIVE mg/dL
Specific Gravity, Urine: 1.019 (ref 1.005–1.030)
pH: 6 (ref 5.0–8.0)

## 2020-08-20 LAB — LIPASE, BLOOD: Lipase: 28 U/L (ref 11–51)

## 2020-08-20 NOTE — ED Provider Notes (Signed)
HPI  SUBJECTIVE:  Kelly Roberts is a 30 y.o. female who presents with 3 days of worsening right flank pain.  It is sharp, lasting minutes.  States it radiates across her abdomen when she lays down.  She reports nausea, anorexia and right back pain starting today.  She states that the car ride over here was painful.  She had a normal bowel movement this morning.  No fevers, vomiting, abdominal distention, urinary complaints, pelvic pain, coughing, wheezing, shortness of breath.  No antipyretic in the past 6 hours.  She has not tried anything for this.  Symptoms better with lying on her right side, worse with walking, coughing, sitting up.  It is not associated with stooling.  She has a past medical history of UTIs and remote history of ovarian cyst that resolved on its own.  No history of abdominal surgery, diabetes, hypertension, gallbladder disease, pancreatitis, pyelonephritis, nephrolithiasis.  LMP: She has an IUD.  She is irregular.  She is fairly certain that she is not pregnant.  PMD: None.   Past Medical History:  Diagnosis Date  . Anemia   . Asthma   . Medical history non-contributory   . Mental disorder   . Vaginal Pap smear, abnormal     Past Surgical History:  Procedure Laterality Date  . NO PAST SURGERIES      Family History  Problem Relation Age of Onset  . Healthy Mother   . Healthy Father     Social History   Tobacco Use  . Smoking status: Former Smoker    Packs/day: 0.25    Types: Cigarettes, E-cigarettes    Quit date: 10/08/2014    Years since quitting: 5.8  . Smokeless tobacco: Never Used  Vaping Use  . Vaping Use: Never used  Substance Use Topics  . Alcohol use: Not Currently    Alcohol/week: 3.0 standard drinks    Types: 3 Shots of liquor per week    Comment: last use 06/12/20  . Drug use: Not Currently    Types: Marijuana    No current facility-administered medications for this encounter.  Current Outpatient Medications:  .  albuterol (PROVENTIL  HFA;VENTOLIN HFA) 108 (90 Base) MCG/ACT inhaler, Inhale 2 puffs into the lungs every 4 (four) hours as needed for wheezing or shortness of breath., Disp: 1 Inhaler, Rfl: 0 .  levonorgestrel (MIRENA) 20 MCG/24HR IUD, 1 each by Intrauterine route once., Disp: , Rfl:  .  sertraline (ZOLOFT) 50 MG tablet, Take 50 mg by mouth daily. (Patient not taking: No sig reported), Disp: , Rfl:   Allergies  Allergen Reactions  . Lactose Intolerance (Gi)      ROS  As noted in HPI.   Physical Exam  BP 109/76   Pulse 83   Temp 98.7 F (37.1 C) (Oral)   Resp 18   SpO2 100%   Constitutional: Well developed, well nourished, no acute distress Eyes:  EOMI, conjunctiva normal bilaterally HENT: Normocephalic, atraumatic,mucus membranes moist Respiratory: Normal inspiratory effort Cardiovascular: Normal rate GI: Normal appearance, soft.  Positive exquisite right lower quadrant tenderness.  No guarding, rebound.  Positive Rovsing, positive obturator sign.  Negative Murphy, active bowel sounds, no distention. Back: No CVAT skin: No rash, skin intact Musculoskeletal: no deformities Neurologic: Alert & oriented x 3, no focal neuro deficits Psychiatric: Speech and behavior appropriate   ED Course   Medications - No data to display  No orders of the defined types were placed in this encounter.   No results found  for this or any previous visit (from the past 24 hour(s)). No results found.  ED Clinical Impression  1. Right lower quadrant abdominal pain      ED Assessment/Plan  Primary concern is appendicitis.  In the differential is ovarian cyst, ileitis, ectopic pregnancy, renal colic.  Doubt ovarian torsion, TOA based on history and absence of fever, and normal vital signs.  Patient was unable to provide urine sample while here.  Her vitals are stable, she looks well, this has been going on for 3 days, feel that she is stable to go by private vehicle to the emergency department.  Advised her to  go to the [facility].  Contacted charge RN and informed them that she is on her way.  Advised patient to not have anything to eat or drink until ER evaluation is complete.  She declined pain or nausea medicine here.   No orders of the defined types were placed in this encounter.    *This clinic note was created using Dragon dictation software. Therefore, there may be occasional mistakes despite careful proofreading.  ?    Domenick Gong, MD 08/20/20 1205

## 2020-08-20 NOTE — ED Notes (Signed)
Patient is being discharged from the Urgent Care and sent to the Emergency Department via POV . Per Dr. Chaney Malling, patient is in need of higher level of care due to r/o appendicitis. Patient is aware and verbalizes understanding of plan of care.  Vitals:   08/20/20 1109  BP: 109/76  Pulse: 83  Resp: 18  Temp: 98.7 F (37.1 C)  SpO2: 100%

## 2020-08-20 NOTE — ED Triage Notes (Addendum)
Pt is present today with RUQ abdominal pain that started Monday. Pt states that she did feel nauseous this morning. Pt denies any diarrhea, constipation, vomiting, or urinary sx.

## 2020-08-20 NOTE — Discharge Instructions (Addendum)
Do not have anything to eat or drink until your ER evaluation is complete.  I am concerned that you have appendicitis.  Let them know if your pain changes, gets worse.  It is completely fine to get pain medication.  I will contact them and let them know that you are on your way.

## 2020-08-20 NOTE — ED Triage Notes (Signed)
Pt states that she is having RLQ abd pain that started Monday- pt having nausea no vomiting- pt denies urinary symptoms

## 2020-12-29 ENCOUNTER — Encounter: Payer: Self-pay | Admitting: Advanced Practice Midwife

## 2020-12-29 ENCOUNTER — Other Ambulatory Visit: Payer: Self-pay

## 2020-12-29 ENCOUNTER — Ambulatory Visit: Payer: Self-pay | Admitting: Advanced Practice Midwife

## 2020-12-29 DIAGNOSIS — Z91410 Personal history of adult physical and sexual abuse: Secondary | ICD-10-CM

## 2020-12-29 DIAGNOSIS — Z113 Encounter for screening for infections with a predominantly sexual mode of transmission: Secondary | ICD-10-CM

## 2020-12-29 LAB — WET PREP FOR TRICH, YEAST, CLUE
Trichomonas Exam: NEGATIVE
Yeast Exam: NEGATIVE

## 2020-12-29 NOTE — Progress Notes (Signed)
Pt here for STD screening.  Wet mount results reviewed, no treatment required.  Addyson Traub M Mykaela Arena, RN ? ?

## 2020-12-29 NOTE — Progress Notes (Signed)
[facility] Department STI clinic/screening visit  Subjective:  Kelly Roberts is a 30 y.o. female being seen today for an STI screening visit. The patient reports they do not have symptoms.  Patient reports that they do not desire a pregnancy in the next year.   They reported they are not interested in discussing contraception today.  Patient's last menstrual period was 06/19/2020 (approximate).   Patient has the following medical conditions:   Patient Active Problem List   Diagnosis Date Noted   Abnormal Pap smear of cervix 06/16/17 LGSIL 07/08/2020   Asthma 07/08/2020   Overweight BMI=28.7 07/08/2020   Anxiety 07/08/2020    Chief Complaint  Patient presents with   SEXUALLY TRANSMITTED DISEASE    screening    HPI  Patient reports no symptoms today.  States she gets regular screenings and has not had sex in the last 5 months.    Last HIV test per patient/review of record was 07/08/2020   Patient reports last pap was 07/08/2020   See flowsheet for further details and programmatic requirements.    The following portions of the patient's history were reviewed and updated as appropriate: allergies, current medications, past medical history, past social history, past surgical history and problem list.  Objective:  There were no vitals filed for this visit.  Physical Exam Vitals and nursing note reviewed.  Constitutional:      Appearance: Normal appearance.  HENT:     Head: Normocephalic and atraumatic.     Mouth/Throat:     Mouth: Mucous membranes are moist.     Pharynx: Oropharynx is clear. No oropharyngeal exudate or posterior oropharyngeal erythema.  Neck:     Vascular: No carotid bruit.  Pulmonary:     Effort: Pulmonary effort is normal.  Abdominal:     Palpations: Abdomen is soft. There is no mass.     Tenderness: There is no abdominal tenderness. There is no rebound.     Comments: Abdomen soft, round, nontender   Genitourinary:    General: Normal  vulva.     Exam position: Lithotomy position.     Pubic Area: No rash or pubic lice.      Labia:        Right: No rash or lesion.        Left: No rash or lesion.      Vagina: Vaginal discharge (minimum amount of discharge, no odor noted) present. No erythema, bleeding or lesions.     Cervix: Normal.     Uterus: Normal.      Adnexa: Right adnexa normal and left adnexa normal.     Rectum: Normal.     Comments: Mirena IUD string visualized.  Musculoskeletal:     Cervical back: Normal range of motion.  Lymphadenopathy:     Head:     Right side of head: No preauricular or posterior auricular adenopathy.     Left side of head: No preauricular or posterior auricular adenopathy.     Cervical: Cervical adenopathy present.     Right cervical: Superficial cervical adenopathy (bilaterally, slighty enlarged, nontender) present. No deep cervical adenopathy.    Left cervical: No superficial, deep or posterior cervical adenopathy.     Upper Body:     Right upper body: No supraclavicular or axillary adenopathy.     Left upper body: No supraclavicular or axillary adenopathy.     Lower Body: No right inguinal adenopathy. No left inguinal adenopathy.  Skin:    General: Skin is warm and  dry.     Findings: No rash.  Neurological:     Mental Status: She is alert and oriented to person, place, and time.     Assessment and Plan:  Kelly Roberts is a 30 y.o. female presenting to the [facility] Department for STI screening  1. Screening examination for STD (sexually transmitted disease) -Treat wet mount per standing orders  - WET PREP FOR TRICH, YEAST, CLUE - Chlamydia/Gonorrhea [facility]     No follow-ups on file.  No future appointments.  Alberteen Spindle, CNM
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
## 2020-12-29 NOTE — Progress Notes (Signed)
Physical exam performed by Glenna Fellows, FNP.   Wet mount reviewed, no treatment needed at this time. Glenna Fellows, NP   I was present during exam and reviewed history and agree with assessment and plan. Hazle Coca, CNM

## 2021-05-31 ENCOUNTER — Ambulatory Visit: Payer: Self-pay

## 2021-06-04 ENCOUNTER — Ambulatory Visit: Payer: Self-pay | Admitting: Family Medicine

## 2021-06-04 ENCOUNTER — Encounter: Payer: Self-pay | Admitting: Family Medicine

## 2021-06-04 ENCOUNTER — Other Ambulatory Visit: Payer: Self-pay

## 2021-06-04 DIAGNOSIS — Z113 Encounter for screening for infections with a predominantly sexual mode of transmission: Secondary | ICD-10-CM

## 2021-06-04 DIAGNOSIS — F339 Major depressive disorder, recurrent, unspecified: Secondary | ICD-10-CM

## 2021-06-04 LAB — WET PREP FOR TRICH, YEAST, CLUE
Trichomonas Exam: NEGATIVE
Yeast Exam: NEGATIVE

## 2021-06-04 NOTE — Progress Notes (Signed)
Pt here for STD screening. Negative wet prep. No treatment. Marchelle Folks, LCSW given.  ?

## 2021-06-04 NOTE — Patient Instructions (Signed)
Steps to prevent BV and yeast: Wear all-cotton underwear Sleep without underwear Take showers instead of baths Wear loose fitting clothing, especially during warm/hot weather Use a hair dryer on low after bathing to dry the area Avoid scented soaps and body washes Do not douche May try over the counter probiotics or boric acid gel or suppositories Stop smoking  
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
## 2021-06-23 ENCOUNTER — Ambulatory Visit: Payer: Self-pay | Admitting: Licensed Clinical Social Worker
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
## 2021-07-04 DIAGNOSIS — M79671 Pain in right foot: Secondary | ICD-10-CM | POA: Insufficient documentation

## 2021-07-05 ENCOUNTER — Emergency Department
Admission: EM | Admit: 2021-07-05 | Discharge: 2021-07-05 | Disposition: A | Discharge status: Home / Self Care | Payer: Self-pay | Attending: Emergency Medicine | Admitting: Emergency Medicine

## 2021-07-05 ENCOUNTER — Emergency Department: Payer: Self-pay

## 2021-07-05 ENCOUNTER — Other Ambulatory Visit: Payer: Self-pay

## 2021-07-05 DIAGNOSIS — M79671 Pain in right foot: Secondary | ICD-10-CM

## 2021-07-05 NOTE — ED Provider Notes (Signed)
? ?  [facility] ?Provider Note ? ? ? Event Date/Time  ? First MD Initiated Contact with Patient 07/05/21 0126   ?  (approximate) ? ? ?History  ? ?Foot Pain ? ? ?HPI ? ?Kelly Roberts is a 31 y.o. female with a past history of anxiety who comes the ED complaining of right foot pain since 7:00 PM this evening.  Denies any trip or fall, no ankle twist.  Did not drop anything on the foot.  No fever, pain is nonradiating, worse with weightbearing.  Moderate intensity. ?  ? ? ?Physical Exam  ? ?Triage Vital Signs: ?ED Triage Vitals  ?Enc Vitals Group  ?   BP 07/04/21 2359 129/86  ?   Pulse Rate 07/04/21 2359 81  ?   Resp 07/04/21 2359 16  ?   Temp 07/04/21 2359 98.6 ?F (37 ?C)  ?   Temp Source 07/04/21 2359 Oral  ?   SpO2 07/04/21 2359 96 %  ?   Weight 07/05/21 0004 140 lb (63.5 kg)  ?   Height 07/05/21 0004 4\' 8"  (1.422 m)  ?   Head Circumference --   ?   Peak Flow --   ?   Pain Score 07/05/21 0003 9  ?   Pain Loc --   ?   Pain Edu? --   ?   Excl. in GC? --   ? ? ?Most recent vital signs: ?Vitals:  ? 07/04/21 2359  ?BP: 129/86  ?Pulse: 81  ?Resp: 16  ?Temp: 98.6 ?F (37 ?C)  ?SpO2: 96%  ? ? ? ?General: Awake, no distress.  ?CV:  Good peripheral perfusion.  Normal pedal pulses ?Resp:  Normal effort.  ?Abd:  No distention.  ?Other:  No calf tenderness or swelling.  No inflammatory skin changes.  There is mild tenderness along the track of the right fifth metatarsal without wound or ecchymosis or any signs of trauma.  No bony point tenderness. ? ?ED Results / Procedures / Treatments  ? ?Labs ?(all labs ordered are listed, but only abnormal results are displayed) ?Labs Reviewed - No data to display ? ? ?EKG ? ? ? ? ?RADIOLOGY ?X-ray right foot viewed interpreted by me, appears normal.  Radiology report reviewed. ? ? ? ?PROCEDURES: ? ?Critical Care performed: No ? ?Procedures ? ? ?MEDICATIONS ORDERED IN ED: ?Medications - No data to display ? ? ?IMPRESSION / MDM / ASSESSMENT AND PLAN / ED COURSE  ?I  reviewed the triage vital signs and the nursing notes. ?             ?               ?Patient presents with right foot pain, atraumatic.  No signs of inflammatory process, no wounds, no signs of trauma.  X-rays negative for fracture.  I do not see any evidence of infection or tendon rupture.  Doubt septic arthritis or DVT.  Recommend ice and NSAIDs and follow-up with primary care. ?  ? ? ?FINAL CLINICAL IMPRESSION(S) / ED DIAGNOSES  ? ?Final diagnoses:  ?Right foot pain  ? ? ? ?Rx / DC Orders  ? ?ED Discharge Orders   ? ? None  ? ?  ? ? ? ?Note:  This document was prepared using Dragon voice recognition software and may include unintentional dictation errors. ?  ?07/06/21, MD ?07/05/21 0200 ? ?

## 2021-07-05 NOTE — ED Triage Notes (Signed)
Patient reports right foot pain without injury since 1900 yesterday. States she is unable to bear weight due to pain. CMS intact. AOX4. Resp even, unlabored on RA.  ?

## 2021-07-05 NOTE — Discharge Instructions (Addendum)
Take ibuprofen 600mg  every 6 hours or naproxen 500mg  every 12 hours for the foot pain. ?

## 2021-07-09 NOTE — Progress Notes (Signed)
PAP letter mailed today.  Repeat PAP and PE due 06-2021.  Caira Poche, RN ? ?

## 2021-08-02 ENCOUNTER — Ambulatory Visit: Payer: Self-pay

## 2021-08-17 ENCOUNTER — Ambulatory Visit (LOCAL_COMMUNITY_HEALTH_CENTER): Payer: Self-pay | Admitting: Nurse Practitioner

## 2021-08-17 ENCOUNTER — Encounter: Payer: Self-pay | Admitting: Nurse Practitioner

## 2021-08-17 VITALS — BP 111/78 | Ht <= 58 in | Wt 145.6 lb

## 2021-08-17 DIAGNOSIS — Z113 Encounter for screening for infections with a predominantly sexual mode of transmission: Secondary | ICD-10-CM

## 2021-08-17 DIAGNOSIS — Z01419 Encounter for gynecological examination (general) (routine) without abnormal findings: Secondary | ICD-10-CM

## 2021-08-17 DIAGNOSIS — Z3049 Encounter for surveillance of other contraceptives: Secondary | ICD-10-CM

## 2021-08-17 DIAGNOSIS — Z3009 Encounter for other general counseling and advice on contraception: Secondary | ICD-10-CM

## 2021-08-17 LAB — WET PREP FOR TRICH, YEAST, CLUE
Trichomonas Exam: NEGATIVE
Yeast Exam: NEGATIVE
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
## 2021-08-17 NOTE — Progress Notes (Signed)
Pt here for PE, Pap and IUD removal.  Wet prep results reviewed, no treatment required per SO.  Condoms declined.  Berdie Ogren, RN

## 2021-08-21 LAB — GONOCOCCUS CULTURE

## 2021-08-24 LAB — IGP, APTIMA HPV
HPV Aptima: NEGATIVE
PAP Smear Comment: 0

## 2021-11-14 ENCOUNTER — Emergency Department
Admission: EM | Admit: 2021-11-14 | Discharge: 2021-11-14 | Disposition: A | Discharge status: Home / Self Care | Payer: Self-pay | Attending: Emergency Medicine | Admitting: Emergency Medicine

## 2021-11-14 ENCOUNTER — Emergency Department: Payer: Self-pay

## 2021-11-14 ENCOUNTER — Other Ambulatory Visit: Payer: Self-pay

## 2021-11-14 ENCOUNTER — Encounter: Payer: Self-pay | Admitting: Intensive Care

## 2021-11-14 DIAGNOSIS — Z3490 Encounter for supervision of normal pregnancy, unspecified, unspecified trimester: Secondary | ICD-10-CM

## 2021-11-14 DIAGNOSIS — R197 Diarrhea, unspecified: Secondary | ICD-10-CM | POA: Insufficient documentation

## 2021-11-14 DIAGNOSIS — R109 Unspecified abdominal pain: Secondary | ICD-10-CM | POA: Insufficient documentation

## 2021-11-14 DIAGNOSIS — R112 Nausea with vomiting, unspecified: Secondary | ICD-10-CM

## 2021-11-14 DIAGNOSIS — O219 Vomiting of pregnancy, unspecified: Secondary | ICD-10-CM | POA: Insufficient documentation

## 2021-11-14 DIAGNOSIS — Z3A01 Less than 8 weeks gestation of pregnancy: Secondary | ICD-10-CM | POA: Insufficient documentation

## 2021-11-14 LAB — COMPREHENSIVE METABOLIC PANEL
ALT: 12 U/L (ref 0–44)
AST: 25 U/L (ref 15–41)
Albumin: 4.4 g/dL (ref 3.5–5.0)
Alkaline Phosphatase: 52 U/L (ref 38–126)
Anion gap: 9 (ref 5–15)
BUN: 16 mg/dL (ref 6–20)
CO2: 20 mmol/L — ABNORMAL LOW (ref 22–32)
Calcium: 9.1 mg/dL (ref 8.9–10.3)
Chloride: 107 mmol/L (ref 98–111)
Creatinine, Ser: 0.67 mg/dL (ref 0.44–1.00)
GFR, Estimated: >60 mL/min (ref >60)
Glucose, Bld: 114 mg/dL — ABNORMAL HIGH (ref 70–99)
Potassium: 4.1 mmol/L (ref 3.5–5.1)
Sodium: 136 mmol/L (ref 135–145)
Total Bilirubin: 0.6 mg/dL (ref 0.3–1.2)
Total Protein: 8 g/dL (ref 6.5–8.1)

## 2021-11-14 LAB — URINALYSIS, ROUTINE W REFLEX MICROSCOPIC
Bacteria, UA: NONE SEEN
Bilirubin Urine: NEGATIVE
Glucose, UA: NEGATIVE mg/dL
Hgb urine dipstick: NEGATIVE
Ketones, ur: NEGATIVE mg/dL
Leukocytes,Ua: NEGATIVE
Nitrite: NEGATIVE
Protein, ur: 30 mg/dL — AB
Specific Gravity, Urine: 1.029 (ref 1.005–1.030)
pH: 5 (ref 5.0–8.0)

## 2021-11-14 LAB — CBC
HCT: 37 % (ref 36.0–46.0)
Hemoglobin: 12.2 g/dL (ref 12.0–15.0)
MCH: 30.8 pg (ref 26.0–34.0)
MCHC: 33 g/dL (ref 30.0–36.0)
MCV: 93.4 fL (ref 80.0–100.0)
Platelets: 270 10*3/uL (ref 150–400)
RBC: 3.96 MIL/uL (ref 3.87–5.11)
RDW: 12.3 % (ref 11.5–15.5)
WBC: 12.7 10*3/uL — ABNORMAL HIGH (ref 4.0–10.5)
nRBC: 0 % (ref 0.0–0.2)

## 2021-11-14 LAB — HCG, QUANTITATIVE, PREGNANCY: hCG, Beta Chain, Quant, S: 378 m[IU]/mL — ABNORMAL HIGH (ref <5)

## 2021-11-14 LAB — LIPASE, BLOOD: Lipase: 35 U/L (ref 11–51)

## 2021-11-14 LAB — POC URINE PREG, ED: Preg Test, Ur: POSITIVE — AB

## 2021-11-14 NOTE — ED Provider Notes (Signed)
[facility] Provider Note  Patient Contact: 5:01 PM (approximate)   History   Abdominal Pain   HPI  Kelly Roberts is a 31 y.o. female who presents the emergency department with lower abdominal pain, nausea and vomiting.  Patient states that she did have a positive pregnancy test on 11/09/2021.  Last menstrual cycle was reportedly at the end of June.  Patient states that she also had an episode of diarrhea.  No blood in vomiting or diarrhea.  No fevers or chills reported.  No URI symptoms.  No vaginal bleeding or discharge.  No urinary changes     Physical Exam   Triage Vital Signs: ED Triage Vitals  Enc Vitals Group     BP 11/14/21 1630 116/71     Pulse Rate 11/14/21 1630 84     Resp 11/14/21 1631 15     Temp 11/14/21 1631 98.5 F (36.9 C)     Temp Source 11/14/21 1631 Oral     SpO2 11/14/21 1631 97 %     Weight 11/14/21 0955 140 lb (63.5 kg)     Height 11/14/21 0955 4\' 8"  (1.422 m)     Head Circumference --      Peak Flow --      Pain Score 11/14/21 0955 8     Pain Loc --      Pain Edu? --      Excl. in GC? --     Most recent vital signs: Vitals:   11/14/21 1630 11/14/21 1631  BP: 116/71   Pulse: 84 88  Resp:  15  Temp:  98.5 F (36.9 C)  SpO2:  97%     General: Alert and in no acute distress.   Cardiovascular:  Good peripheral perfusion Respiratory: Normal respiratory effort without tachypnea or retractions. Lungs CTAB. Good air entry to the bases with no decreased or absent breath sounds. Gastrointestinal: Bowel sounds 4 quadrants. Soft and nontender to palpation in all quadrants. No guarding or rigidity. No palpable masses. No distention. No CVA tenderness. Musculoskeletal: Full range of motion to all extremities.  Neurologic:  No gross focal neurologic deficits are appreciated.  Skin:   No rash noted Other:   ED Results / Procedures / Treatments   Labs (all labs ordered are listed, but only abnormal results are  displayed) Labs Reviewed  COMPREHENSIVE METABOLIC PANEL - Abnormal; Notable for the following components:      Result Value   CO2 20 (*)    Glucose, Bld 114 (*)    All other components within normal limits  CBC - Abnormal; Notable for the following components:   WBC 12.7 (*)    All other components within normal limits  URINALYSIS, ROUTINE W REFLEX MICROSCOPIC - Abnormal; Notable for the following components:   Color, Urine YELLOW (*)    APPearance HAZY (*)    Protein, ur 30 (*)    All other components within normal limits  HCG, QUANTITATIVE, PREGNANCY - Abnormal; Notable for the following components:   hCG, Beta Chain, Quant, S 378 (*)    All other components within normal limits  POC URINE PREG, ED - Abnormal; Notable for the following components:   Preg Test, Ur POSITIVE (*)    All other components within normal limits  LIPASE, BLOOD     EKG     RADIOLOGY  I personally viewed, evaluated, and interpreted these images as part of my medical decision making, as well as reviewing the written report  by the radiologist.  ED Provider Interpretation: No evidence of IUP at this time.  With a hCG of 378, unlikely to visualize gestational sac.  Differential includes ectopic, recent miscarriage.  US OB LESS THAN 14 WEEKS WITH OB TRANSVAGINAL  Result Date: 11/14/2021 CLINICAL DATA:  Pelvic pain in 1st trimester pregnancy. EXAM: OBSTETRIC <14 WK [place] AND TRANSVAGINAL OB US TECHNIQUE: Both transabdominal and transvaginal ultrasound examinations were performed for complete evaluation of the gestation as well as the maternal uterus, adnexal regions, and pelvic cul-de-sac. Transvaginal technique was performed to assess early pregnancy. COMPARISON:  None Available. FINDINGS: Intrauterine gestational sac: None Maternal uterus/adnexae: No fibroids identified. Small right ovarian corpus luteum noted. Left ovary is unremarkable. No evidence of adnexal mass or abnormal free fluid. IMPRESSION: Pregnancy  of unknown anatomic location (no intrauterine gestational sac or adnexal mass identified). Differential diagnosis includes recent spontaneous abortion, IUP too early to visualize, and non-visualized ectopic pregnancy. Recommend correlation with serial beta-hCG levels, and follow up US if warranted clinically. Electronically Signed   By: Danae Orleans M.D.   On: 11/14/2021 12:36    PROCEDURES:  Critical Care performed: No  Procedures   MEDICATIONS ORDERED IN ED: Medications - No data to display   IMPRESSION / MDM / ASSESSMENT AND PLAN / ED COURSE  I reviewed the triage vital signs and the nursing notes.                              Differential diagnosis includes, but is not limited to, pregnancy, viral illness, ectopic pregnancy, UTI  Patient's presentation is most consistent with acute presentation with potential threat to life or bodily function.   Patient's diagnosis is consistent with pregnancy, vomiting and diarrhea.  Patient presents to the ED with a complaint of lower abdominal pain, episode of diarrhea and emesis.  Emesis and diarrhea were nonbloody in nature.  She had no fevers.  White blood cell count is slightly elevated at 12.7.  Abdominal exam is reassuring with no tenderness.  She has had no fevers at home.  At this time I have a low suspicion for intra-abdominal pathology such as cholecystitis or appendicitis.  Patient does have an hCG of 378, no evidence of intrauterine pregnancy on ultrasound.  Given the hcg level, I feel it would be unlikely to visualize pregnancy.  Differential includes a recent miscarriage, ectopic pregnancy.  I have recommended that patient follow-up with [place] in 2 days for repeat hCG.  Patient develops other symptoms such as fevers or chills or more localized abdominal pain, worsening pain, to return sooner.  At this time given the positive pregnancy test I will not pursue CT scan of the abdomen pelvis.  Again I have a low suspicion for diverticulitis,  appendicitis, cholecystitis given a benign abdominal exam, absence of fevers, anorexia. Referral to OBGYN will be given to patient. Patient is given ED precautions to return to the ED for any worsening or new symptoms.        FINAL CLINICAL IMPRESSION(S) / ED DIAGNOSES   Final diagnoses:  Pregnancy, unspecified gestational age  Nausea vomiting and diarrhea     Rx / DC Orders   ED Discharge Orders     None        Note:  This document was prepared using Dragon voice recognition software and may include unintentional dictation errors.   Lanette Hampshire 11/14/21 1712    Pilar Jarvis, MD 11/14/21 [phone]

## 2021-11-14 NOTE — ED Notes (Signed)
Dc ppw provided to pt. RX info and followup reviewed as applicable. Pt provides verbal consent for dc and declines VS at time of dc. Pt ambultory off unit to lobby. 

## 2021-11-14 NOTE — ED Triage Notes (Addendum)
Reports possitive pregnancy test on 11/09/21 and last menstrual cycle the end of June 2023. Patient c/o sharp abdominal pains and diarrhea that started this AM.

## 2021-12-30 DIAGNOSIS — Z3482 Encounter for supervision of other normal pregnancy, second trimester: Secondary | ICD-10-CM | POA: Insufficient documentation

## 2022-01-03 DIAGNOSIS — O99210 Obesity complicating pregnancy, unspecified trimester: Secondary | ICD-10-CM | POA: Diagnosis present

## 2022-01-03 DIAGNOSIS — O99891 Other specified diseases and conditions complicating pregnancy: Secondary | ICD-10-CM

## 2022-01-03 DIAGNOSIS — Z8659 Personal history of other mental and behavioral disorders: Secondary | ICD-10-CM

## 2022-01-03 DIAGNOSIS — F17200 Nicotine dependence, unspecified, uncomplicated: Secondary | ICD-10-CM | POA: Insufficient documentation

## 2022-03-07 ENCOUNTER — Other Ambulatory Visit: Payer: Self-pay | Admitting: Obstetrics and Gynecology

## 2022-03-07 DIAGNOSIS — Z3A20 20 weeks gestation of pregnancy: Secondary | ICD-10-CM

## 2022-03-07 DIAGNOSIS — D509 Iron deficiency anemia, unspecified: Secondary | ICD-10-CM

## 2022-03-07 NOTE — Progress Notes (Signed)
Iron infusion orders placed, 1x weekly x 5wks.  EDD 07/23/22

## 2022-03-21 NOTE — L&D Delivery Note (Signed)
Delivery Note At 7:19 AM a viable female was delivered via Vaginal, Spontaneous (Presentation: Right Occiput Anterior).  APGAR: 3, 7; weight 3,800 grams (8 lb 6 oz) .   Placenta status: Spontaneous, Intact.  Cord: 3 vessels with the following complications: None.  Cord pH: pending  Anesthesia: Epidural Episiotomy: None Lacerations: None Suture Repair:  n/a Est. Blood Loss (mL): 280  Mom to postpartum.  Baby to Couplet care / Skin to Skin (preliminary).  Called to see patient due to recurrent late decelerations with maternal cervical dilation at 7 cm.  She was having recurrent late decelerations at first with moderate variability.  On exam, her cervix was able to be reduced and stayed that way.  Due to concern for need for attempted forceps or cesarean delivery, decision was made with patient to begin pushing. She pushed for a little under 2 hours.  There was good maternal effort for the first in the series of pushes. The 2nd and 3rd push demonstrated some maternal fatigue. She was making very slow change with intermittent fetal tachycardia and then normal range heart rate. She would have late decelerations with pushing, occasionally dropping below 100 bpm (fetal), but mostly staying above 100 bpm.  With the intermitten fetal tachycardia and normal maternal temperature, decision was made to continue as long as she was making progress.  Eventually the fetal head passed beneath the pubic bone and she then pushed to deliver a viable female infant.  The head followed by shoulders, which delivered without difficulty, and the rest of the body.  No nuchal cord noted.  Baby to mom's chest.  Cord clamped and cut after > 1 min delay.  No cord blood obtained.  The infant had low tone initially. So, the cord was cut and cord blood was obtained in case it was helpful.  APGARs were 3 and 7 and the baby was able to go to mom's chest.  Placenta delivered spontaneously, intact, with a 3-vessel cord.  No vaginal, cervical, or  perineal lacerations. All counts correct.  Hemostasis obtained with IV pitocin and fundal massage. QBL 280 mL.     Thomasene Mohair, MD 07/15/2022, 7:37 AM

## 2022-04-08 ENCOUNTER — Ambulatory Visit
Admission: RE | Admit: 2022-04-08 | Discharge: 2022-04-08 | Disposition: A | Discharge status: Home / Self Care | Payer: Medicaid Other | Source: Ambulatory Visit | Attending: Obstetrics and Gynecology | Admitting: Obstetrics and Gynecology

## 2022-04-08 DIAGNOSIS — O99012 Anemia complicating pregnancy, second trimester: Secondary | ICD-10-CM | POA: Diagnosis present

## 2022-04-08 DIAGNOSIS — D509 Iron deficiency anemia, unspecified: Secondary | ICD-10-CM | POA: Diagnosis not present

## 2022-04-08 MED ORDER — SODIUM CHLORIDE 0.9 % IV SOLN
200.0000 mg | INTRAVENOUS | Status: DC
  Administered 2022-04-08: 200 mg via INTRAVENOUS
  Filled 2022-04-08: qty 200

## 2022-04-15 ENCOUNTER — Ambulatory Visit
Admission: RE | Admit: 2022-04-15 | Discharge: 2022-04-15 | Disposition: A | Discharge status: Home / Self Care | Payer: Medicaid Other | Source: Ambulatory Visit | Attending: Obstetrics and Gynecology | Admitting: Obstetrics and Gynecology

## 2022-04-15 DIAGNOSIS — O99012 Anemia complicating pregnancy, second trimester: Secondary | ICD-10-CM | POA: Diagnosis present

## 2022-04-15 DIAGNOSIS — D509 Iron deficiency anemia, unspecified: Secondary | ICD-10-CM | POA: Diagnosis not present

## 2022-04-15 MED ORDER — SODIUM CHLORIDE 0.9 % IV SOLN
200.0000 mg | Freq: Once | INTRAVENOUS | Status: AC
  Administered 2022-04-15: 200 mg via INTRAVENOUS
  Filled 2022-04-15: qty 200

## 2022-04-22 ENCOUNTER — Ambulatory Visit
Admission: RE | Admit: 2022-04-22 | Discharge: 2022-04-22 | Disposition: A | Discharge status: Home / Self Care | Payer: Medicaid Other | Source: Ambulatory Visit | Attending: Obstetrics and Gynecology | Admitting: Obstetrics and Gynecology

## 2022-04-22 DIAGNOSIS — D509 Iron deficiency anemia, unspecified: Secondary | ICD-10-CM | POA: Diagnosis not present

## 2022-04-22 DIAGNOSIS — O99012 Anemia complicating pregnancy, second trimester: Secondary | ICD-10-CM | POA: Insufficient documentation

## 2022-04-22 DIAGNOSIS — Z3A20 20 weeks gestation of pregnancy: Secondary | ICD-10-CM | POA: Diagnosis not present

## 2022-04-22 MED ORDER — SODIUM CHLORIDE 0.9 % IV SOLN
200.0000 mg | Freq: Once | INTRAVENOUS | Status: AC
  Administered 2022-04-22: 200 mg via INTRAVENOUS
  Filled 2022-04-22: qty 200

## 2022-04-28 ENCOUNTER — Ambulatory Visit
Admission: RE | Admit: 2022-04-28 | Discharge: 2022-04-28 | Disposition: A | Discharge status: Home / Self Care | Payer: Medicaid Other | Source: Ambulatory Visit | Attending: Obstetrics and Gynecology | Admitting: Obstetrics and Gynecology

## 2022-04-28 DIAGNOSIS — D509 Iron deficiency anemia, unspecified: Secondary | ICD-10-CM | POA: Insufficient documentation

## 2022-04-28 DIAGNOSIS — O99013 Anemia complicating pregnancy, third trimester: Secondary | ICD-10-CM | POA: Diagnosis present

## 2022-04-28 DIAGNOSIS — Z3A28 28 weeks gestation of pregnancy: Secondary | ICD-10-CM | POA: Insufficient documentation

## 2022-04-28 MED ORDER — SODIUM CHLORIDE 0.9 % IV SOLN
200.0000 mg | Freq: Once | INTRAVENOUS | Status: AC
  Administered 2022-04-28: 200 mg via INTRAVENOUS
  Filled 2022-04-28: qty 200

## 2022-05-02 ENCOUNTER — Other Ambulatory Visit: Payer: Self-pay | Admitting: Obstetrics

## 2022-05-02 DIAGNOSIS — D509 Iron deficiency anemia, unspecified: Secondary | ICD-10-CM

## 2022-05-02 NOTE — Progress Notes (Signed)
Maternal iron deficiency anemia at 28.2 weeks ordered iron transfusions x 8 weeks  Kelly Roberts CNM

## 2022-05-05 ENCOUNTER — Ambulatory Visit: Payer: Medicaid Other

## 2022-05-05 ENCOUNTER — Encounter
Admission: RE | Admit: 2022-05-05 | Discharge: 2022-05-05 | Disposition: A | Discharge status: Home / Self Care | Payer: Medicaid Other | Source: Ambulatory Visit | Attending: Obstetrics | Admitting: Obstetrics

## 2022-05-05 DIAGNOSIS — O99012 Anemia complicating pregnancy, second trimester: Secondary | ICD-10-CM | POA: Insufficient documentation

## 2022-05-05 DIAGNOSIS — D509 Iron deficiency anemia, unspecified: Secondary | ICD-10-CM | POA: Diagnosis not present

## 2022-05-05 MED ORDER — SODIUM CHLORIDE 0.9 % IV SOLN
300.0000 mg | INTRAVENOUS | Status: DC
  Administered 2022-05-05: 300 mg via INTRAVENOUS
  Filled 2022-05-05: qty 300

## 2022-05-12 ENCOUNTER — Ambulatory Visit
Admission: RE | Admit: 2022-05-12 | Discharge: 2022-05-12 | Disposition: A | Discharge status: Home / Self Care | Payer: Medicaid Other | Source: Ambulatory Visit | Attending: Obstetrics | Admitting: Obstetrics

## 2022-05-12 DIAGNOSIS — O99012 Anemia complicating pregnancy, second trimester: Secondary | ICD-10-CM | POA: Diagnosis present

## 2022-05-12 DIAGNOSIS — D509 Iron deficiency anemia, unspecified: Secondary | ICD-10-CM | POA: Diagnosis not present

## 2022-05-12 MED ORDER — SODIUM CHLORIDE 0.9 % IV SOLN
300.0000 mg | Freq: Once | INTRAVENOUS | Status: AC
  Administered 2022-05-12: 300 mg via INTRAVENOUS
  Filled 2022-05-12: qty 300

## 2022-05-20 ENCOUNTER — Ambulatory Visit
Admission: RE | Admit: 2022-05-20 | Discharge: 2022-05-20 | Disposition: A | Discharge status: Home / Self Care | Payer: Medicaid Other | Source: Ambulatory Visit | Attending: Obstetrics | Admitting: Obstetrics

## 2022-05-20 DIAGNOSIS — Z3A28 28 weeks gestation of pregnancy: Secondary | ICD-10-CM | POA: Insufficient documentation

## 2022-05-20 DIAGNOSIS — O99013 Anemia complicating pregnancy, third trimester: Secondary | ICD-10-CM | POA: Insufficient documentation

## 2022-05-20 MED ORDER — SODIUM CHLORIDE 0.9 % IV SOLN
300.0000 mg | Freq: Once | INTRAVENOUS | Status: AC
  Administered 2022-05-20: 300 mg via INTRAVENOUS
  Filled 2022-05-20: qty 300

## 2022-05-27 ENCOUNTER — Ambulatory Visit
Admission: RE | Admit: 2022-05-27 | Discharge: 2022-05-27 | Disposition: A | Discharge status: Home / Self Care | Payer: Medicaid Other | Source: Ambulatory Visit | Attending: Obstetrics | Admitting: Obstetrics

## 2022-05-27 DIAGNOSIS — O99012 Anemia complicating pregnancy, second trimester: Secondary | ICD-10-CM | POA: Diagnosis present

## 2022-05-27 DIAGNOSIS — D509 Iron deficiency anemia, unspecified: Secondary | ICD-10-CM | POA: Diagnosis not present

## 2022-05-27 DIAGNOSIS — Z3A00 Weeks of gestation of pregnancy not specified: Secondary | ICD-10-CM | POA: Insufficient documentation

## 2022-05-27 MED ORDER — SODIUM CHLORIDE 0.9 % IV SOLN
300.0000 mg | Freq: Once | INTRAVENOUS | Status: AC
  Administered 2022-05-27: 300 mg via INTRAVENOUS
  Filled 2022-05-27: qty 300

## 2022-06-02 ENCOUNTER — Ambulatory Visit
Admission: RE | Admit: 2022-06-02 | Discharge: 2022-06-02 | Disposition: A | Discharge status: Home / Self Care | Payer: Medicaid Other | Source: Ambulatory Visit | Attending: Obstetrics | Admitting: Obstetrics

## 2022-06-02 DIAGNOSIS — O99012 Anemia complicating pregnancy, second trimester: Secondary | ICD-10-CM | POA: Insufficient documentation

## 2022-06-02 DIAGNOSIS — D509 Iron deficiency anemia, unspecified: Secondary | ICD-10-CM | POA: Diagnosis not present

## 2022-06-02 DIAGNOSIS — Z3A00 Weeks of gestation of pregnancy not specified: Secondary | ICD-10-CM | POA: Insufficient documentation

## 2022-06-02 MED ORDER — SODIUM CHLORIDE 0.9 % IV SOLN
300.0000 mg | Freq: Once | INTRAVENOUS | Status: AC
  Administered 2022-06-02: 300 mg via INTRAVENOUS
  Filled 2022-06-02: qty 300

## 2022-06-10 ENCOUNTER — Ambulatory Visit
Admission: RE | Admit: 2022-06-10 | Discharge: 2022-06-10 | Disposition: A | Discharge status: Home / Self Care | Payer: Medicaid Other | Source: Ambulatory Visit | Attending: Obstetrics | Admitting: Obstetrics

## 2022-06-10 DIAGNOSIS — D509 Iron deficiency anemia, unspecified: Secondary | ICD-10-CM | POA: Diagnosis present

## 2022-06-10 DIAGNOSIS — Z3A33 33 weeks gestation of pregnancy: Secondary | ICD-10-CM | POA: Diagnosis not present

## 2022-06-10 DIAGNOSIS — O26843 Uterine size-date discrepancy, third trimester: Secondary | ICD-10-CM | POA: Insufficient documentation

## 2022-06-10 DIAGNOSIS — O99013 Anemia complicating pregnancy, third trimester: Secondary | ICD-10-CM | POA: Insufficient documentation

## 2022-06-10 MED ORDER — SODIUM CHLORIDE 0.9 % IV SOLN
300.0000 mg | Freq: Once | INTRAVENOUS | Status: AC
  Administered 2022-06-10: 300 mg via INTRAVENOUS
  Filled 2022-06-10: qty 300

## 2022-06-17 ENCOUNTER — Ambulatory Visit
Admission: RE | Admit: 2022-06-17 | Discharge: 2022-06-17 | Disposition: A | Discharge status: Home / Self Care | Payer: Medicaid Other | Source: Ambulatory Visit | Attending: Family Medicine | Admitting: Family Medicine

## 2022-06-17 DIAGNOSIS — D509 Iron deficiency anemia, unspecified: Secondary | ICD-10-CM | POA: Insufficient documentation

## 2022-06-17 DIAGNOSIS — Z3A00 Weeks of gestation of pregnancy not specified: Secondary | ICD-10-CM | POA: Insufficient documentation

## 2022-06-17 DIAGNOSIS — O99012 Anemia complicating pregnancy, second trimester: Secondary | ICD-10-CM | POA: Diagnosis present

## 2022-06-17 MED ORDER — SODIUM CHLORIDE 0.9 % IV SOLN
300.0000 mg | Freq: Once | INTRAVENOUS | Status: AC
  Administered 2022-06-17: 300 mg via INTRAVENOUS
  Filled 2022-06-17: qty 300

## 2022-06-23 ENCOUNTER — Ambulatory Visit
Admission: RE | Admit: 2022-06-23 | Discharge: 2022-06-23 | Disposition: A | Discharge status: Home / Self Care | Payer: Medicaid Other | Source: Ambulatory Visit | Attending: Obstetrics | Admitting: Obstetrics

## 2022-06-23 DIAGNOSIS — D509 Iron deficiency anemia, unspecified: Secondary | ICD-10-CM | POA: Diagnosis not present

## 2022-06-23 DIAGNOSIS — O99013 Anemia complicating pregnancy, third trimester: Secondary | ICD-10-CM | POA: Insufficient documentation

## 2022-06-23 MED ORDER — SODIUM CHLORIDE 0.9 % IV SOLN
300.0000 mg | Freq: Once | INTRAVENOUS | Status: AC
  Administered 2022-06-23: 300 mg via INTRAVENOUS
  Filled 2022-06-23: qty 300

## 2022-07-06 ENCOUNTER — Other Ambulatory Visit: Payer: Self-pay | Admitting: Obstetrics and Gynecology

## 2022-07-06 DIAGNOSIS — Z3490 Encounter for supervision of normal pregnancy, unspecified, unspecified trimester: Secondary | ICD-10-CM

## 2022-07-07 ENCOUNTER — Observation Stay
Admission: EM | Admit: 2022-07-07 | Discharge: 2022-07-07 | Disposition: A | Discharge status: Home / Self Care | Payer: Medicaid Other | Attending: Obstetrics and Gynecology | Admitting: Obstetrics and Gynecology

## 2022-07-07 ENCOUNTER — Other Ambulatory Visit: Payer: Self-pay

## 2022-07-07 DIAGNOSIS — Z87891 Personal history of nicotine dependence: Secondary | ICD-10-CM | POA: Insufficient documentation

## 2022-07-07 DIAGNOSIS — O479 False labor, unspecified: Secondary | ICD-10-CM | POA: Diagnosis present

## 2022-07-07 DIAGNOSIS — Z79899 Other long term (current) drug therapy: Secondary | ICD-10-CM | POA: Insufficient documentation

## 2022-07-07 DIAGNOSIS — O471 False labor at or after 37 completed weeks of gestation: Secondary | ICD-10-CM | POA: Diagnosis present

## 2022-07-07 DIAGNOSIS — Z3A37 37 weeks gestation of pregnancy: Secondary | ICD-10-CM | POA: Insufficient documentation

## 2022-07-07 MED ORDER — CALCIUM CARBONATE ANTACID 500 MG PO CHEW: 2.0000 | CHEWABLE_TABLET | ORAL | Status: DC | PRN

## 2022-07-07 MED ORDER — ACETAMINOPHEN 500 MG PO TABS: 1000.0000 mg | ORAL_TABLET | Freq: Four times a day (QID) | ORAL | Status: DC | PRN

## 2022-07-07 NOTE — OB Triage Note (Signed)

## 2022-07-07 NOTE — Discharge Summary (Signed)
Kelly Roberts is a 32 y.o. female. She is at [redacted]w[redacted]d gestation. Patient's last menstrual period was 10/12/2021 (within days). Estimated Date of Delivery: 07/23/22  Prenatal care site: [facility]  Chief complaint: uterine contractions  HPI: Kelly Roberts presents to L&D with complaints of uterine contractions  Factors complicating pregnancy: Uterine size discrepancy  Anemia Anxiety and depression w/hx of PPD Obesity Vaping Rubella non immune  S: Resting comfortably.  no VB.no LOF,  Active fetal movement.   Maternal Medical History:  Past Medical Hx:  has a past medical history of Anemia, Asthma, Medical history non-contributory, Mental disorder, and Vaginal Pap smear, abnormal.    Past Surgical Hx:  has a past surgical history that includes No past surgeries.   Allergies  Allergen Reactions   Lactose Intolerance (Gi)      Prior to Admission medications   Medication Sig Start Date End Date Taking? Authorizing Provider  albuterol (PROVENTIL HFA;VENTOLIN HFA) 108 (90 Base) MCG/ACT inhaler Inhale 2 puffs into the lungs every 4 (four) hours as needed for wheezing or shortness of breath. 04/22/18  Yes Cuthriell, Delorise Royals, PA-C  sertraline (ZOLOFT) 50 MG tablet Take 50 mg by mouth daily.   Yes [provider]  zolpidem (AMBIEN) 5 MG tablet Take 1 tablet (5 mg total) by mouth at bedtime as needed for sleep. Patient not taking: Reported on 07/21/2017 04/28/15 02/17/19  Karena Addison, CNM    Social History: She  reports that she quit smoking about 2 years ago. Her smoking use included cigarettes and e-cigarettes. She has a 1.75 pack-year smoking history. She has never used smokeless tobacco. She reports current alcohol use of about 3.0 standard drinks of alcohol per week. She reports that she does not currently use drugs after having used the following drugs: Marijuana.  Family History: family history includes Diabetes in her maternal grandfather and mother; Healthy in her  father. ,no history of gyn cancers  Review of Systems: A full review of systems was performed and negative except as noted in the HPI.    O:  BP 121/72 (BP Location: Left Arm)   Pulse 88   Temp 97.9 F (36.6 C) (Oral)   Resp 16   Ht  (1.422 m)   Wt 82.1 kg   LMP 10/12/2021 (Within Days)   BMI 40.58 kg/m  No results found for this or any previous visit (from the past 48 hour(s)).   Constitutional: NAD, AAOx3  HE/ENT: extraocular movements grossly intact, moist mucous membranes CV: RRR PULM: nl respiratory effort, CTABL Abd: gravid, non-tender, non-distended, soft  Ext: Non-tender, Nonedmeatous Psych: mood appropriate, speech normal Pelvic : deferred SVE: Dilation: 3 Effacement (%): 50 Cervical Position: Posterior, Middle Station: Ballotable Presentation: Vertex Exam by:: Marzetta Merino, RN   Fetal Monitor: Baseline: 135 bpm Variability: moderate Accels: Present Decels: none Toco: irregular, every 2-4 minutes  Category: I   Assessment: 32 y.o. 103w5d here for antenatal surveillance during pregnancy. Prodromal labor present, no cervical change in 2 hours. patient to stay hydrated and come back to labor and delivery once contractions intensify   Principle diagnosis:  There were no encounter diagnoses.   Plan: Labor: prodromal labor present  Fetal Wellbeing: Reassuring Cat 1 tracing. Reactive NST  D/c home stable, precautions reviewed, follow-up as scheduled.   ----- Chari Manning, CNM Certified Nurse Midwife [facility]

## 2022-07-14 ENCOUNTER — Encounter: Payer: Self-pay | Admitting: Obstetrics and Gynecology

## 2022-07-14 ENCOUNTER — Inpatient Hospital Stay
Admission: EM | Admit: 2022-07-14 | Discharge: 2022-07-16 | DRG: 806 | Disposition: A | Discharge status: Home / Self Care | Payer: Medicaid Other | Attending: Obstetrics | Admitting: Obstetrics

## 2022-07-14 ENCOUNTER — Other Ambulatory Visit: Payer: Self-pay

## 2022-07-14 DIAGNOSIS — D6959 Other secondary thrombocytopenia: Secondary | ICD-10-CM | POA: Diagnosis present

## 2022-07-14 DIAGNOSIS — O9952 Diseases of the respiratory system complicating childbirth: Secondary | ICD-10-CM | POA: Diagnosis present

## 2022-07-14 DIAGNOSIS — O09899 Supervision of other high risk pregnancies, unspecified trimester: Secondary | ICD-10-CM

## 2022-07-14 DIAGNOSIS — J45909 Unspecified asthma, uncomplicated: Secondary | ICD-10-CM | POA: Diagnosis present

## 2022-07-14 DIAGNOSIS — D509 Iron deficiency anemia, unspecified: Secondary | ICD-10-CM | POA: Diagnosis present

## 2022-07-14 DIAGNOSIS — F32A Depression, unspecified: Secondary | ICD-10-CM | POA: Diagnosis present

## 2022-07-14 DIAGNOSIS — O99344 Other mental disorders complicating childbirth: Secondary | ICD-10-CM | POA: Diagnosis present

## 2022-07-14 DIAGNOSIS — F419 Anxiety disorder, unspecified: Secondary | ICD-10-CM | POA: Diagnosis present

## 2022-07-14 DIAGNOSIS — Z3A38 38 weeks gestation of pregnancy: Secondary | ICD-10-CM

## 2022-07-14 DIAGNOSIS — F17290 Nicotine dependence, other tobacco product, uncomplicated: Secondary | ICD-10-CM | POA: Diagnosis present

## 2022-07-14 DIAGNOSIS — O99210 Obesity complicating pregnancy, unspecified trimester: Secondary | ICD-10-CM | POA: Diagnosis present

## 2022-07-14 DIAGNOSIS — Z8659 Personal history of other mental and behavioral disorders: Secondary | ICD-10-CM

## 2022-07-14 DIAGNOSIS — O99214 Obesity complicating childbirth: Secondary | ICD-10-CM | POA: Diagnosis present

## 2022-07-14 DIAGNOSIS — Z23 Encounter for immunization: Secondary | ICD-10-CM | POA: Diagnosis not present

## 2022-07-14 DIAGNOSIS — Z3490 Encounter for supervision of normal pregnancy, unspecified, unspecified trimester: Secondary | ICD-10-CM

## 2022-07-14 DIAGNOSIS — O99334 Smoking (tobacco) complicating childbirth: Secondary | ICD-10-CM | POA: Diagnosis present

## 2022-07-14 DIAGNOSIS — O9902 Anemia complicating childbirth: Principal | ICD-10-CM | POA: Diagnosis present

## 2022-07-14 DIAGNOSIS — O9912 Other diseases of the blood and blood-forming organs and certain disorders involving the immune mechanism complicating childbirth: Secondary | ICD-10-CM | POA: Diagnosis present

## 2022-07-14 DIAGNOSIS — O99891 Other specified diseases and conditions complicating pregnancy: Secondary | ICD-10-CM

## 2022-07-14 DIAGNOSIS — O99013 Anemia complicating pregnancy, third trimester: Secondary | ICD-10-CM | POA: Diagnosis present

## 2022-07-14 DIAGNOSIS — O26893 Other specified pregnancy related conditions, third trimester: Secondary | ICD-10-CM | POA: Diagnosis present

## 2022-07-14 DIAGNOSIS — D696 Thrombocytopenia, unspecified: Secondary | ICD-10-CM | POA: Diagnosis present

## 2022-07-14 DIAGNOSIS — Z2839 Other underimmunization status: Secondary | ICD-10-CM

## 2022-07-14 LAB — TYPE AND SCREEN
ABO/RH(D): O POS
Antibody Screen: NEGATIVE

## 2022-07-14 LAB — CBC
HCT: 33.6 % — ABNORMAL LOW (ref 36.0–46.0)
Hemoglobin: 11.5 g/dL — ABNORMAL LOW (ref 12.0–15.0)
MCH: 31.3 pg (ref 26.0–34.0)
MCHC: 34.2 g/dL (ref 30.0–36.0)
MCV: 91.6 fL (ref 80.0–100.0)
Platelets: 122 10*3/uL — ABNORMAL LOW (ref 150–400)
RBC: 3.67 MIL/uL — ABNORMAL LOW (ref 3.87–5.11)
RDW: 13.6 % (ref 11.5–15.5)
WBC: 7.9 10*3/uL (ref 4.0–10.5)
nRBC: 0 % (ref 0.0–0.2)

## 2022-07-14 MED ORDER — LACTATED RINGERS IV SOLN: INTRAVENOUS | Status: DC

## 2022-07-14 MED ORDER — LACTATED RINGERS IV SOLN: 500.0000 mL | INTRAVENOUS | Status: DC | PRN

## 2022-07-14 MED ORDER — LACTATED RINGERS IV SOLN
500.0000 mL | Freq: Once | INTRAVENOUS | Status: AC
  Administered 2022-07-14: 500 mL via INTRAVENOUS

## 2022-07-14 MED ORDER — PHENYLEPHRINE 80 MCG/ML (10ML) SYRINGE FOR IV PUSH (FOR BLOOD PRESSURE SUPPORT)
80.0000 ug | PREFILLED_SYRINGE | INTRAVENOUS | Status: DC | PRN
  Administered 2022-07-15 (×2): 80 ug via INTRAVENOUS

## 2022-07-14 MED ORDER — FENTANYL-BUPIVACAINE-NACL 0.5-0.125-0.9 MG/250ML-% EP SOLN
12.0000 mL/h | EPIDURAL | Status: DC | PRN
  Administered 2022-07-15: 12 mL/h via EPIDURAL
  Filled 2022-07-14: qty 250

## 2022-07-14 MED ORDER — ALBUTEROL SULFATE (2.5 MG/3ML) 0.083% IN NEBU: 2.5000 mg | INHALATION_SOLUTION | RESPIRATORY_TRACT | Status: DC | PRN

## 2022-07-14 MED ORDER — SOD CITRATE-CITRIC ACID 500-334 MG/5ML PO SOLN: 30.0000 mL | ORAL | Status: DC | PRN

## 2022-07-14 MED ORDER — EPHEDRINE 5 MG/ML INJ
10.0000 mg | INTRAVENOUS | Status: DC | PRN
  Administered 2022-07-15: 10 mg via INTRAVENOUS

## 2022-07-14 MED ORDER — ONDANSETRON HCL 4 MG/2ML IJ SOLN
4.0000 mg | Freq: Four times a day (QID) | INTRAMUSCULAR | Status: DC | PRN
  Administered 2022-07-15: 4 mg via INTRAVENOUS
  Filled 2022-07-14: qty 2

## 2022-07-14 MED ORDER — OXYTOCIN 10 UNIT/ML IJ SOLN: 10.0000 [IU] | Freq: Once | INTRAMUSCULAR | Status: DC

## 2022-07-14 MED ORDER — LIDOCAINE HCL (PF) 1 % IJ SOLN: 30.0000 mL | INTRAMUSCULAR | Status: DC | PRN

## 2022-07-14 MED ORDER — OXYTOCIN BOLUS FROM INFUSION
333.0000 mL | Freq: Once | INTRAVENOUS | Status: AC
  Administered 2022-07-15: 333 mL via INTRAVENOUS

## 2022-07-14 MED ORDER — EPHEDRINE 5 MG/ML INJ
10.0000 mg | INTRAVENOUS | Status: DC | PRN
  Filled 2022-07-14: qty 5

## 2022-07-14 MED ORDER — DIPHENHYDRAMINE HCL 50 MG/ML IJ SOLN: 12.5000 mg | INTRAMUSCULAR | Status: DC | PRN

## 2022-07-14 MED ORDER — PHENYLEPHRINE 80 MCG/ML (10ML) SYRINGE FOR IV PUSH (FOR BLOOD PRESSURE SUPPORT)
80.0000 ug | PREFILLED_SYRINGE | INTRAVENOUS | Status: DC | PRN
  Filled 2022-07-14: qty 10

## 2022-07-14 MED ORDER — OXYTOCIN-SODIUM CHLORIDE 30-0.9 UT/500ML-% IV SOLN
2.5000 [IU]/h | INTRAVENOUS | Status: DC
  Administered 2022-07-15: 2.5 [IU]/h via INTRAVENOUS
  Filled 2022-07-14: qty 500

## 2022-07-14 NOTE — Anesthesia Preprocedure Evaluation (Addendum)
Anesthesia Evaluation  Patient identified by MRN, date of birth, ID band Patient awake    Reviewed: Allergy & Precautions, NPO status , Patient's Chart, lab work & pertinent test results  History of Anesthesia Complications Negative for: history of anesthetic complications  Airway Mallampati: II   Neck ROM: Full    Dental   Pulmonary asthma , former smoker (quit 2 years ago)   Pulmonary exam normal breath sounds clear to auscultation       Cardiovascular Exercise Tolerance: Good negative cardio ROS Normal cardiovascular exam Rhythm:Regular Rate:Normal     Neuro/Psych  PSYCHIATRIC DISORDERS Anxiety     negative neurological ROS     GI/Hepatic negative GI ROS,,,  Endo/Other  Obesity   Renal/GU negative Renal ROS     Musculoskeletal   Abdominal   Peds  Hematology  (+) Blood dyscrasia, anemia   Anesthesia Other Findings 32 yo G3P2002 at 78 5/7 requesting labor epidural.  Reproductive/Obstetrics                             Anesthesia Physical Anesthesia Plan  ASA: 2  Anesthesia Plan: Epidural   Post-op Pain Management:    Induction:   PONV Risk Score and Plan: 2 and Treatment may vary due to age or medical condition  Airway Management Planned: Natural Airway  Additional Equipment:   Intra-op Plan:   Post-operative Plan:   Informed Consent: I have reviewed the patients History and Physical, chart, labs and discussed the procedure including the risks, benefits and alternatives for the proposed anesthesia with the patient or authorized representative who has indicated his/her understanding and acceptance.     Dental Advisory Given  Plan Discussed with:   Anesthesia Plan Comments: (Patient reports no bleeding problems and no anticoagulant use.   Patient consented for risks of anesthesia including but not limited to:  - adverse reactions to medications - risk of bleeding,  infection and or nerve damage from epidural that could lead to paralysis - risk of headache or failed epidural - nerve damage due to positioning - that if epidural is used for C-section that there is a chance of epidural failure requiring spinal placement or conversion to GA - damage to heart, brain, lungs, other parts of body or loss of life  Patient voiced understanding.)       Anesthesia Quick Evaluation
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
## 2022-07-15 ENCOUNTER — Encounter: Payer: Self-pay | Admitting: Obstetrics and Gynecology

## 2022-07-15 ENCOUNTER — Inpatient Hospital Stay: Payer: Medicaid Other | Admitting: Anesthesiology

## 2022-07-15 LAB — RPR: RPR Ser Ql: NONREACTIVE

## 2022-07-15 MED ORDER — LACTATED RINGERS AMNIOINFUSION
INTRAVENOUS | Status: DC
  Filled 2022-07-15 (×2): qty 1000

## 2022-07-15 MED ORDER — ONDANSETRON HCL 4 MG PO TABS: 4.0000 mg | ORAL_TABLET | ORAL | Status: DC | PRN

## 2022-07-15 MED ORDER — ONDANSETRON HCL 4 MG/2ML IJ SOLN: 4.0000 mg | INTRAMUSCULAR | Status: DC | PRN

## 2022-07-15 MED ORDER — SODIUM CHLORIDE 0.9% FLUSH
3.0000 mL | Freq: Two times a day (BID) | INTRAVENOUS | Status: DC
  Administered 2022-07-15: 3 mL via INTRAVENOUS

## 2022-07-15 MED ORDER — IBUPROFEN 600 MG PO TABS
600.0000 mg | ORAL_TABLET | Freq: Four times a day (QID) | ORAL | Status: DC
  Administered 2022-07-15 – 2022-07-16 (×5): 600 mg via ORAL
  Filled 2022-07-15 (×5): qty 1

## 2022-07-15 MED ORDER — SODIUM CHLORIDE 0.9 % IV SOLN: 250.0000 mL | INTRAVENOUS | Status: DC | PRN

## 2022-07-15 MED ORDER — WITCH HAZEL-GLYCERIN EX PADS: 1.0000 | MEDICATED_PAD | CUTANEOUS | Status: DC | PRN

## 2022-07-15 MED ORDER — SODIUM CHLORIDE 0.9% FLUSH: 3.0000 mL | INTRAVENOUS | Status: DC | PRN

## 2022-07-15 MED ORDER — DIPHENHYDRAMINE HCL 25 MG PO CAPS: 25.0000 mg | ORAL_CAPSULE | Freq: Four times a day (QID) | ORAL | Status: DC | PRN

## 2022-07-15 MED ORDER — SENNOSIDES-DOCUSATE SODIUM 8.6-50 MG PO TABS
2.0000 | ORAL_TABLET | Freq: Every day | ORAL | Status: DC
  Administered 2022-07-16: 2 via ORAL
  Filled 2022-07-15: qty 2

## 2022-07-15 MED ORDER — ZOLPIDEM TARTRATE 5 MG PO TABS: 5.0000 mg | ORAL_TABLET | Freq: Every evening | ORAL | Status: DC | PRN

## 2022-07-15 MED ORDER — TERBUTALINE SULFATE 1 MG/ML IJ SOLN
INTRAMUSCULAR | Status: AC
  Administered 2022-07-15: 1 mg
  Filled 2022-07-15: qty 1

## 2022-07-15 MED ORDER — BUPIVACAINE HCL (PF) 0.25 % IJ SOLN
INTRAMUSCULAR | Status: DC | PRN
  Administered 2022-07-15: 5 mL via EPIDURAL

## 2022-07-15 MED ORDER — FERROUS SULFATE 325 (65 FE) MG PO TABS
325.0000 mg | ORAL_TABLET | Freq: Two times a day (BID) | ORAL | Status: DC
  Administered 2022-07-15 – 2022-07-16 (×2): 325 mg via ORAL
  Filled 2022-07-15 (×2): qty 1

## 2022-07-15 MED ORDER — MEASLES, MUMPS & RUBELLA VAC IJ SOLR
0.5000 mL | INTRAMUSCULAR | Status: AC | PRN
  Administered 2022-07-16: 0.5 mL via SUBCUTANEOUS
  Filled 2022-07-15: qty 0.5

## 2022-07-15 MED ORDER — BENZOCAINE-MENTHOL 20-0.5 % EX AERO: 1.0000 | INHALATION_SPRAY | CUTANEOUS | Status: DC | PRN

## 2022-07-15 MED ORDER — LIDOCAINE-EPINEPHRINE (PF) 1.5 %-1:200000 IJ SOLN
INTRAMUSCULAR | Status: DC | PRN
  Administered 2022-07-15: 3 mL via EPIDURAL

## 2022-07-15 MED ORDER — ACETAMINOPHEN 500 MG PO TABS
1000.0000 mg | ORAL_TABLET | Freq: Four times a day (QID) | ORAL | Status: DC
  Administered 2022-07-15 – 2022-07-16 (×5): 1000 mg via ORAL
  Filled 2022-07-15 (×5): qty 2

## 2022-07-15 MED ORDER — DIBUCAINE (PERIANAL) 1 % EX OINT: 1.0000 | TOPICAL_OINTMENT | CUTANEOUS | Status: DC | PRN

## 2022-07-15 MED ORDER — PRENATAL MULTIVITAMIN CH
1.0000 | ORAL_TABLET | Freq: Every day | ORAL | Status: DC
  Administered 2022-07-15 – 2022-07-16 (×2): 1 via ORAL
  Filled 2022-07-15 (×2): qty 1

## 2022-07-15 MED ORDER — COCONUT OIL OIL: 1.0000 | TOPICAL_OIL | Status: DC | PRN

## 2022-07-15 MED ORDER — SIMETHICONE 80 MG PO CHEW: 80.0000 mg | CHEWABLE_TABLET | ORAL | Status: DC | PRN

## 2022-07-15 NOTE — Discharge Instructions (Signed)
Vaginal Delivery, Care After Refer to this sheet in the next few weeks. These discharge instructions provide you with information on caring for yourself after delivery. Your caregiver may also give you specific instructions. Your treatment has been planned according to the most current medical practices available, but problems sometimes occur. Call your caregiver if you have any problems or questions after you go home. HOME CARE INSTRUCTIONS Take over-the-counter or prescription medicines only as directed by your caregiver or pharmacist. Do not drink alcohol, especially if you are breastfeeding or taking medicine to relieve pain. Do not smoke tobacco. Continue to use good perineal care. Good perineal care includes: Wiping your perineum from back to front Keeping your perineum clean. You can do sitz baths twice a day, to help keep this area clean Do not use tampons, douche or have sex until your caregiver says it is okay. Shower only and avoid sitting in submerged water, aside from sitz baths Wear a well-fitting bra that provides breast support. Eat healthy foods. Drink enough fluids to keep your urine clear or pale yellow. Eat high-fiber foods such as whole grain cereals and breads, brown rice, beans, and fresh fruits and vegetables every day. These foods may help prevent or relieve constipation. Avoid constipation with high fiber foods or medications, such as miralax or metamucil Follow your caregiver's recommendations regarding resumption of activities such as climbing stairs, driving, lifting, exercising, or traveling. Talk to your caregiver about resuming sexual activities. Resumption of sexual activities is dependent upon your risk of infection, your rate of healing, and your comfort and desire to resume sexual activity. Try to have someone help you with your household activities and your newborn for at least a few days after you leave the hospital. Rest as much as possible. Try to rest or  take a nap when your newborn is sleeping. Increase your activities gradually. Keep all of your scheduled postpartum appointments. It is very important to keep your scheduled follow-up appointments. At these appointments, your caregiver will be checking to make sure that you are healing physically and emotionally. SEEK MEDICAL CARE IF:  You are passing large clots from your vagina. Save any clots to show your caregiver. You have a foul smelling discharge from your vagina. You have trouble urinating. You are urinating frequently. You have pain when you urinate. You have a change in your bowel movements. You have increasing redness, pain, or swelling near your vaginal incision (episiotomy) or vaginal tear. You have pus draining from your episiotomy or vaginal tear. Your episiotomy or vaginal tear is separating. You have painful, hard, or reddened breasts. You have a severe headache. You have blurred vision or see spots. You feel sad or depressed. You have thoughts of hurting yourself or your newborn. You have questions about your care, the care of your newborn, or medicines. You are dizzy or light-headed. You have a rash. You have nausea or vomiting. You were breastfeeding and have not had a menstrual period within 12 weeks after you stopped breastfeeding. You are not breastfeeding and have not had a menstrual period by the 12th week after delivery. You have a fever. SEEK IMMEDIATE MEDICAL CARE IF:  You have persistent pain. You have chest pain. You have shortness of breath. You faint. You have leg pain. You have stomach pain. Your vaginal bleeding saturates two or more sanitary pads in 1 hour. MAKE SURE YOU:  Understand these instructions. Will watch your condition. Will get help right away if you are not doing well or   get worse. Document Released: 03/04/2000 Document Revised: 07/22/2013 Document Reviewed: 11/02/2011 ExitCare Patient Information 2015 [employer]. This  information is not intended to replace advice given to you by your health care provider. Make sure you discuss any questions you have with your health care provider.  Sitz Bath A sitz bath is a warm water bath taken in the sitting position. The water covers only the hips and butt (buttocks). We recommend using one that fits in the toilet, to help with ease of use and cleanliness. It may be used for either healing or cleaning purposes. Sitz baths are also used to relieve pain, itching, or muscle tightening (spasms). The water may contain medicine. Moist heat will help you heal and relax.  HOME CARE  Take 3 to 4 sitz baths a day. Fill the bathtub half-full with warm water. Sit in the water and open the drain a little. Turn on the warm water to keep the tub half-full. Keep the water running constantly. Soak in the water for 15 to 20 minutes. After the sitz bath, pat the affected area dry. GET HELP RIGHT AWAY IF: You get worse instead of better. Stop the sitz baths if you get worse. MAKE SURE YOU: Understand these instructions. Will watch your condition. Will get help right away if you are not doing well or get worse. Document Released: 04/14/2004 Document Revised: 11/30/2011 Document Reviewed: 07/05/2010 ExitCare Patient Information 2015 [employer]. This information is not intended to replace advice given to you by your health care provider. Make sure you discuss any questions you have with your health care provider.   

## 2022-07-15 NOTE — Progress Notes (Signed)
L&D Note    Subjective:  Called to LDR room for fetal decels  Feeling comfortable with epidural  Objective:   Vitals:   07/15/22 0039 07/15/22 0040 07/15/22 0100 07/15/22 0132  BP: 130/67  115/67   Pulse: 82  (!) 107   Temp:    98.2 F (36.8 C)  TempSrc:    Oral  SpO2:  98% 98%   Weight:      Height:        Current Vital Signs 24h Vital Sign Ranges  T 98.2 F (36.8 C) Temp  Avg: 98.3 F (36.8 C)  Min: 98.2 F (36.8 C)  Max: 98.4 F (36.9 C)  BP 115/67 BP  Min: 115/67  Max: 131/73  HR (!) 107 Pulse  Avg: 89.9  Min: 82  Max: 107  RR   No data recorded  SaO2 98 %   SpO2  Avg: 98.3 %  Min: 97 %  Max: 100 %      Gen: alert, cooperative, no distress FHR: Baseline: 150 bpm, Variability: moderate, Accels: Abscent, Decels: variable and late Toco: regular, every 2-3 minutes, IUPC placed  SVE: Dilation: 7 Effacement (%): 90 Cervical Position: Anterior Station: -1 Presentation: Vertex Exam by:: Robyne Askew, RN  Medications SCHEDULED MEDICATIONS   oxytocin  10 Units Intramuscular Once   oxytocin 40 units in LR 1000 mL  333 mL Intravenous Once    MEDICATION INFUSIONS   fentaNYL 2 mcg/mL w/bupivacaine 0.125% in NS 250 mL 12 mL/hr (07/15/22 0027)   lactated ringers     lactated ringers     oxytocin      PRN MEDICATIONS  albuterol, diphenhydrAMINE, ePHEDrine, ePHEDrine, fentaNYL 2 mcg/mL w/bupivacaine 0.125% in NS 250 mL, lactated ringers, lidocaine (PF), ondansetron, phenylephrine, phenylephrine, sodium citrate-citric acid   Assessment & Plan:  32 y.o. [id number] at [redacted]w[redacted]d admitted for labor and category II tracing  -Labor: Active phase labor. -Fetal Well-being: Category II - attempted to placed FSE but unable to place. Scalp stim noted during attempt.   -GBS: negative -Membranes AROM at 0221 -Intervention: IV fluid bolus, D/C stimulation, and place IUPC. Terb given to reduce uterine stimulation.   -Analgesia: regional anesthesia - delayed hypotension following epidural.   Ephedrine given.     Gustavo Lah, CNM  07/15/2022 2:38 AM  [facility]

## 2022-07-15 NOTE — Lactation Note (Signed)
This note was copied from a baby's chart. Lactation Consultation Note  Patient Name: Kelly Roberts [id number] Date: 07/15/2022 Age:31 hours Reason for consult: Initial assessment;Early term 37-38.6wks   Maternal Data Has patient been taught Hand Expression?: Yes Does the patient have breastfeeding experience prior to this delivery?: Yes How long did the patient breastfeed?: 2 mths (last child is 100 yrs old)  Feeding Mother's Current Feeding Choice: Breast Milk Baby sucking on hands while being transferred in crib to mom's room, baby placed in cradle hold on left breast, mom hand expressed drops of colostrum, baby latched and sucked and few times and stopped, pushed nipple out, mom states she will continue to attempt   [facility] Score Latch: Too sleepy or reluctant, no latch achieved, no sucking elicited.  Audible Swallowing: Spontaneous and intermittent  Type of Nipple: Everted at rest and after stimulation  Comfort (Breast/Nipple): Soft / non-tender  Hold (Positioning): Assistance needed to correctly position infant at breast and maintain latch.      Lactation Tools Discussed/Used  LC name and no written on white board  Interventions Interventions: Breast feeding basics reviewed;Assisted with latch;Skin to skin;Hand express;Support pillows;Education Reviewed frequency of feedings and attempts Discharge Pump: Personal WIC Program: No (plans on signing up for [facility])  Consult Status Consult Status: Follow-up Date: 07/15/22 Follow-up type: In-patient    Kelly Roberts 07/15/2022, 11:13 AM

## 2022-07-15 NOTE — Progress Notes (Signed)
L&D Note    Subjective:  Contractions are feeling more intense, coping well, breathing through contractions, feels better to walk around   Objective:   Vitals:   07/14/22 2036 07/14/22 2039  BP: 128/72   Pulse: 87   Temp: 98.4 F (36.9 C)   TempSrc: Oral   Weight:  82.6 kg  Height:  4\' 8"  (1.422 m)    Current Vital Signs 24h Vital Sign Ranges  T 98.4 F (36.9 C) Temp  Avg: 98.4 F (36.9 C)  Min: 98.4 F (36.9 C)  Max: 98.4 F (36.9 C)  BP 128/72 BP  Min: 128/72  Max: 128/72  HR 87 Pulse  Avg: 87  Min: 87  Max: 87  RR   No data recorded  SaO2     No data recorded      Gen: alert, cooperative, no distress FHR: Baseline: 135 bpm, Variability: moderate, Accels: Present, Decels: none Toco: regular, every 2-4 minutes SVE: Dilation: 5.5 Effacement (%): 90 Cervical Position: Anterior Station: -1 Presentation: Vertex Exam by:: Tami Lin, CNM  Medications SCHEDULED MEDICATIONS   oxytocin  10 Units Intramuscular Once   oxytocin 40 units in LR 1000 mL  333 mL Intravenous Once    MEDICATION INFUSIONS   fentaNYL 2 mcg/mL w/bupivacaine 0.125% in NS 250 mL     lactated ringers     lactated ringers     oxytocin      PRN MEDICATIONS  albuterol, diphenhydrAMINE, ePHEDrine, ePHEDrine, fentaNYL 2 mcg/mL w/bupivacaine 0.125% in NS 250 mL, lactated ringers, lidocaine (PF), ondansetron, phenylephrine, phenylephrine, sodium citrate-citric acid   Assessment & Plan:  32 y.o. [id number] at [redacted]w[redacted]d admitted for labor  -Labor: Active phase labor. -Fetal Well-being: Category I - previously category II -GBS: negative -Membranes intact -Expectant management. -Analgesia:  requesting epidural    Gustavo Lah, CNM  07/15/2022 12:06 AM  [facility]
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
## 2022-07-15 NOTE — Anesthesia Procedure Notes (Signed)
Epidural Patient location during procedure: OB Start time: 07/15/2022 12:14 AM End time: 07/15/2022 12:24 AM  Staffing Anesthesiologist: Reed Breech, MD Performed: anesthesiologist   Preanesthetic Checklist Completed: patient identified, IV checked, risks and benefits discussed, surgical consent, monitors and equipment checked, pre-op evaluation and timeout performed  Epidural Patient position: sitting Prep: Betadine Patient monitoring: heart rate, continuous pulse ox and blood pressure Approach: midline Location: L3-L4 Injection technique: LOR air  Needle:  Needle type: Tuohy  Needle gauge: 17 G Needle length: 9 cm Needle insertion depth: 4.5 cm Catheter at skin depth: 9.5 cm Test dose: negative and 1.5% lidocaine with Epi 1:200 K  Assessment Sensory level: T4  Additional Notes Straightforward placement without apparent complications. Reason for block:procedure for pain

## 2022-07-15 NOTE — Progress Notes (Signed)
L&D Note    Subjective:  Feeling comfortable with epidural  Objective:   Vitals:   07/15/22 0132 07/15/22 0322 07/15/22 0330 07/15/22 0336  BP:  (!) 71/38 (!) 104/41 (!) 93/38  Pulse:  (!) 113 80 88  Temp: 98.2 F (36.8 C)     TempSrc: Oral     SpO2:   98% 97%  Weight:      Height:        Current Vital Signs 24h Vital Sign Ranges  T 98.2 F (36.8 C) Temp  Avg: 98.3 F (36.8 C)  Min: 98.2 F (36.8 C)  Max: 98.4 F (36.9 C)  BP (!) 93/38 BP  Min: 71/38  Max: 131/73  HR 88 Pulse  Avg: 91  Min: 80  Max: 113  RR   No data recorded  SaO2 97 %   SpO2  Avg: 98.1 %  Min: 97 %  Max: 100 %      Gen: alert, cooperative, no distress FHR: Baseline: 160 bpm, Variability: moderate, Accels: Abscent, Decels: variable and late Toco: regular, every 2-5 minutes SVE: 7/90/-1, more cervix on maternal right than maternal left   Medications SCHEDULED MEDICATIONS   oxytocin  10 Units Intramuscular Once   oxytocin 40 units in LR 1000 mL  333 mL Intravenous Once    MEDICATION INFUSIONS   fentaNYL 2 mcg/mL w/bupivacaine 0.125% in NS 250 mL 8 mL/hr (07/15/22 0300)   lactated ringers     lactated ringers     oxytocin      PRN MEDICATIONS  albuterol, diphenhydrAMINE, ePHEDrine, ePHEDrine, fentaNYL 2 mcg/mL w/bupivacaine 0.125% in NS 250 mL, lactated ringers, lidocaine (PF), ondansetron, phenylephrine, phenylephrine, sodium citrate-citric acid   Assessment & Plan:  32 y.o. [id number] at [redacted]w[redacted]d admitted for labor and category II tracing  -Labor: Active phase labor. -Fetal Well-being: Category II -GBS: negative -Membranes: AROM for clear fluid at 0221 -Intervention: change maternal position -Analgesia: regional anesthesia -Multiple position changes and fluid boluses.  Given ephedrine and phenylephrine for hypotension following epidural.   -Discussed concerns about tracing, interventions, and labor progress. Reviewed indications for cesarean birth.  Amorita desires to avoid c/section if possible but  is ok if needed for fetal intolerance.   -Dr. Jean Rosenthal called - provider en route to unit    Gustavo Lah, CNM  07/15/2022 4:11 AM  [facility]

## 2022-07-16 LAB — CBC
HCT: 32.1 % — ABNORMAL LOW (ref 36.0–46.0)
Hemoglobin: 10.7 g/dL — ABNORMAL LOW (ref 12.0–15.0)
MCH: 31.6 pg (ref 26.0–34.0)
MCHC: 33.3 g/dL (ref 30.0–36.0)
MCV: 94.7 fL (ref 80.0–100.0)
Platelets: 116 10*3/uL — ABNORMAL LOW (ref 150–400)
RBC: 3.39 MIL/uL — ABNORMAL LOW (ref 3.87–5.11)
RDW: 14.1 % (ref 11.5–15.5)
WBC: 9.4 10*3/uL (ref 4.0–10.5)
nRBC: 0.2 % (ref 0.0–0.2)

## 2022-07-16 MED ORDER — HYDROCORTISONE 1 % EX CREA
TOPICAL_CREAM | Freq: Two times a day (BID) | CUTANEOUS | Status: DC
  Filled 2022-07-16: qty 28

## 2022-07-16 MED ORDER — IBUPROFEN 600 MG PO TABS: 600.0000 mg | ORAL_TABLET | Freq: Four times a day (QID) | ORAL | 0 refills | Status: DC | PRN

## 2022-07-16 MED ORDER — ACETAMINOPHEN 500 MG PO TABS: 1000.0000 mg | ORAL_TABLET | Freq: Four times a day (QID) | ORAL | 0 refills | Status: DC | PRN

## 2022-07-16 MED ORDER — BENZOCAINE-MENTHOL 20-0.5 % EX AERO: 1.0000 | INHALATION_SPRAY | CUTANEOUS | 1 refills | Status: DC | PRN

## 2022-07-16 MED ORDER — WITCH HAZEL-GLYCERIN EX PADS: 1.0000 | MEDICATED_PAD | CUTANEOUS | 1 refills | Status: DC | PRN

## 2022-07-16 MED ORDER — DIBUCAINE (PERIANAL) 1 % EX OINT: 1.0000 | TOPICAL_OINTMENT | CUTANEOUS | 1 refills | Status: DC | PRN

## 2022-07-16 MED ORDER — FERROUS SULFATE 325 (65 FE) MG PO TABS: 325.0000 mg | ORAL_TABLET | Freq: Two times a day (BID) | ORAL | 3 refills | Status: DC

## 2022-07-16 MED ORDER — PRENATAL MULTIVITAMIN CH: 1.0000 | ORAL_TABLET | Freq: Every day | ORAL | 9 refills | Status: DC

## 2022-07-16 NOTE — Clinical Social Work Maternal (Signed)
CLINICAL SOCIAL WORK MATERNAL/CHILD NOTE  Patient Details  Name: Kelly Roberts MRN: [id number] Date of Birth: 1990-08-28  Date:  07/16/2022  Clinical Social Worker Initiating Note:  Susa Simmonds, [place] Date/Time: Initiated:  07/16/22/0907     Child's Name:  Kelly Roberts   Biological Parents:  Mother, Father   Need for Interpreter:  None   Reason for Referral:  Other (Comment) Inocente Salles Scale)   Address:  [address] [place] [address]    Phone number:  [phone] (home)     Additional phone number: NA  Household Members/Support Persons (HM/SP):   Household Member/Support Person 1   HM/SP Name Relationship DOB or Age  HM/SP -1 Emmanuel Slade FOB    HM/SP -2        HM/SP -3        HM/SP -4        HM/SP -5        HM/SP -6        HM/SP -7        HM/SP -8          Natural Supports (not living in the home):  Extended Family, Friends, Investment banker, corporate Supports: None   Employment: Environmental education officer   Type of Work: Research officer, political party:  [employer] school graduate   Homebound arranged:    Surveyor, quantity Resources:  Medicaid   Other Resources:  Sales executive     Cultural/Religious Considerations Which May Impact Care:  NA  Strengths:  Ability to meet basic needs     Psychotropic Medications:   NA      Pediatrician:     Development worker, international aid List:   [employer] [place]    [place]    [place] [facility]      Pediatrician Fax Number:    Risk Factors/Current Problems:  None   Cognitive State:  Alert     Mood/Affect:  Calm     CSW Assessment: CSW Assessment: CSW received a consult for?MOB's Edinburgh Postnatal Depression Scale results   CSW spoke with MOB and explained CSW's role and reason for referral.   MOB reported she is hanging in their?post-delivery. MOB was alert,?appropriate?during the assessment.    CSW confirmed contact information for MOB. [phone])   MOB and  baby will be living with?FOB [facility] O[facility] Desanctis.)?at discharge. MOB stated she will be going to her mother's home first when discharged due to the stairs at her home.    MOB reported she has food stamps and plans to apply for [employer] next week.    MOB stated she plans on using [facility] for her son.    MOB reported she has a crib,?bassinet, car seat (new), clothing, diapers, and all other items needed for Baby.    MOB reported she has?a history of depression with her second daughter. MOB said she did not receive therapy or counseling then. MOB stated she is currently taking Zoloft. MOB said she answered the last question on the Edinburgh scale incorrectly and thought she circled never, about suicidal ideations. MOB understands when she is overwhelmed to reach out to her support system. MOB has support from her mother, FOB, aunts, uncles, and friends. MOB denied SI, HI, or DV. MOB said she will continue to follow-up with her doctor if she is feeling out of place. MOB denied any need for mental health resources at this time.  CSW provided education and information sheets on PPD and SIDS. MOB verbalized understanding. CSW encouraged MOB to reach out to her Provider with any questions or needs for support or resources, even after discharge.    MOB denied any needs or questions. CSW encouraged MOB to reach out if any arise prior to discharge.    CSW Plan/Description:  No Further Intervention Required/No Barriers to Discharge    Susa Simmonds, LCSWA 07/16/2022, 9:38 AM

## 2022-07-16 NOTE — Anesthesia Postprocedure Evaluation (Signed)
Anesthesia Post Note  Patient: Kelly Roberts  Procedure(s) Performed: AN AD HOC LABOR EPIDURAL  Patient location during evaluation: Mother Baby Anesthesia Type: Epidural Level of consciousness: awake and alert Pain management: pain level controlled Vital Signs Assessment: post-procedure vital signs reviewed and stable Respiratory status: spontaneous breathing, nonlabored ventilation, respiratory function stable and patient connected to nasal cannula oxygen Cardiovascular status: blood pressure returned to baseline and stable Postop Assessment: no apparent nausea or vomiting Anesthetic complications: no  No notable events documented.   Last Vitals:  Vitals:   07/15/22 2306 07/16/22 0733  BP: 116/70 96/85  Pulse: 88 88  Resp: 18 18  Temp: 37 C 36.6 C  SpO2: 98% 97%    Last Pain:  Vitals:   07/16/22 0750  TempSrc:   PainSc: 0-No pain                 Stephanie Coup

## 2022-07-16 NOTE — Lactation Note (Signed)
This note was copied from a baby's chart. Lactation Consultation Note  Patient Name: Kelly Roberts [id number] Date: 07/16/2022 Age:31 hours Reason for consult: Follow-up assessment;Early term 37-38.6wks;Other (Comment) (NP request)   Maternal Data This is mom's 3rd baby, SVD. Mom with history of gestational thrombocytopenia, iron deficient anemia.  On follow-up today assessed baby's latch and assisted mom optimize positioning of baby in football hold.  Has patient been taught Hand Expression?: Yes Does the patient have breastfeeding experience prior to this delivery?: Yes How long did the patient breastfeed?: 2 months  Feeding Mother's Current Feeding Choice: Breast Milk Provided mom with tips and strategies to maximize position and latch technique.Mom was able to independently latch baby and adjust upper lip latch to roll outward on to areola. Multiple audible swallows were noted.Assisted mom with support pillows for football hold positioning. Per mom she will use her breastfeeding pillow once she gets home.  LATCH Score Latch: Grasps breast easily, tongue down, lips flanged, rhythmical sucking. (mom independently corrected upper lip latch and rolled lip outward on to areola. Multiple swallows noted. Mom denies any pain or discomfort with latch.)  Audible Swallowing: Spontaneous and intermittent  Type of Nipple: Everted at rest and after stimulation  Comfort (Breast/Nipple): Soft / non-tender  Hold (Positioning): Assistance needed to correctly position infant at breast and maintain latch. (Assisted mom with maximizing positioning in football hold. Provided support pillows under mom's right arm. Mom reports when she gets home she has a breast pillow.)  LATCH Score: 9  Interventions Interventions: Breast feeding basics reviewed;Adjust position;Support pillows;Education;Hand express (assessed baby's latch)  Discharge Discharge Education: Engorgement and breast care;Warning signs  for feeding baby;Outpatient recommendation Pump: Personal  Consult Status Consult Status: Complete Date: 07/16/22 Follow-up type: In-patient  Update provided to care nurse.  Fuller Song 07/16/2022, 11:03 AM

## 2022-07-16 NOTE — Progress Notes (Signed)
D/c home to self care/verb understanding of d/c instructions

## 2022-07-16 NOTE — Anesthesia Post-op Follow-up Note (Signed)
  Anesthesia Pain Follow-up Note  Patient: Kelly Roberts  Day #: 1  Date of Follow-up: 07/16/2022 Time: 8:16 AM  Last Vitals:  Vitals:   07/15/22 2306 07/16/22 0733  BP: 116/70 96/85  Pulse: 88 88  Resp: 18 18  Temp: 37 C 36.6 C  SpO2: 98% 97%    Level of Consciousness: alert  Pain: none   Side Effects:None  Catheter Site Exam:clean, dry, no drainage     Plan: D/C from anesthesia care at surgeon's request  Stephanie Coup

## 2022-08-12 ENCOUNTER — Telehealth: Payer: Self-pay

## 2022-08-12 NOTE — Telephone Encounter (Signed)
[facility]- Discharge Call Backs-Left Voicemail about the following below. 1-Do you have any questions or concerns about yourself as you heal? 2-Any concerns or questions about your baby? 3-How was your stay at the hospital? 4- Did our team work together to care for you? You should be receiving a survey in the mail soon.   We would really appreciate it if you could fill that out for us and return it in the mail.  We value the feedback to make improvements and continue the great work we do.   If you have any questions please feel free to call me back at [phone]  

## 2022-10-14 ENCOUNTER — Ambulatory Visit
Admission: EM | Admit: 2022-10-14 | Discharge: 2022-10-14 | Disposition: A | Discharge status: Home / Self Care | Payer: Medicaid Other | Attending: Emergency Medicine | Admitting: Emergency Medicine

## 2022-10-14 DIAGNOSIS — B9789 Other viral agents as the cause of diseases classified elsewhere: Secondary | ICD-10-CM | POA: Diagnosis not present

## 2022-10-14 DIAGNOSIS — R519 Headache, unspecified: Secondary | ICD-10-CM | POA: Diagnosis present

## 2022-10-14 DIAGNOSIS — Z87891 Personal history of nicotine dependence: Secondary | ICD-10-CM | POA: Insufficient documentation

## 2022-10-14 DIAGNOSIS — J069 Acute upper respiratory infection, unspecified: Secondary | ICD-10-CM | POA: Diagnosis not present

## 2022-10-14 DIAGNOSIS — J45909 Unspecified asthma, uncomplicated: Secondary | ICD-10-CM | POA: Insufficient documentation

## 2022-10-14 DIAGNOSIS — Z1152 Encounter for screening for COVID-19: Secondary | ICD-10-CM | POA: Diagnosis not present

## 2022-10-14 LAB — SARS CORONAVIRUS 2 BY RT PCR: SARS Coronavirus 2 by RT PCR: NEGATIVE

## 2022-10-14 LAB — GROUP A STREP BY PCR: Group A Strep by PCR: NOT DETECTED

## 2022-10-14 MED ORDER — PROMETHAZINE-DM 6.25-15 MG/5ML PO SYRP: 5.0000 mL | ORAL_SOLUTION | Freq: Four times a day (QID) | ORAL | 0 refills | Status: DC | PRN

## 2022-10-14 MED ORDER — ALBUTEROL SULFATE HFA 108 (90 BASE) MCG/ACT IN AERS: 2.0000 | INHALATION_SPRAY | RESPIRATORY_TRACT | 0 refills | Status: AC | PRN

## 2022-10-14 MED ORDER — BENZONATATE 100 MG PO CAPS: 200.0000 mg | ORAL_CAPSULE | Freq: Three times a day (TID) | ORAL | 0 refills | Status: DC

## 2022-10-14 MED ORDER — IPRATROPIUM BROMIDE 0.06 % NA SOLN: 2.0000 | Freq: Four times a day (QID) | NASAL | 12 refills | Status: DC

## 2022-10-14 MED ORDER — AEROCHAMBER MV MISC: 2 refills | Status: DC
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
## 2022-10-14 NOTE — ED Triage Notes (Signed)
Pt c/o nasal congestion, ear pain, sore throat, headache, chest soreness, body chills x3days  Pt states that when she sleeps, it feels like she will have a panic attack.   Pt has been taking OTC tylenol for pain.

## 2022-10-14 NOTE — Discharge Instructions (Signed)
Your testing today for both COVID and strep are negative.  Your exam is consistent with a viral respiratory infection.  Use the albuterol inhaler with a spacer, 1 to 2 puffs every 4-6 hours, as needed for shortness of breath or wheezing.  Take over-the-counter Tylenol and/or ibuprofen according to the package instructions as needed for fever or bodyaches.  Use the Atrovent nasal spray, 2 squirts in each nostril every 6 hours, as needed for runny nose and postnasal drip.  Use the Tessalon Perles every 8 hours during the day.  Take them with a small sip of water.  They may give you some numbness to the base of your tongue or a metallic taste in your mouth, this is normal.  Use the Promethazine DM cough syrup at bedtime for cough and congestion.  It will make you drowsy so do not take it during the day.  Return for reevaluation or see your primary care provider for any new or worsening symptoms.

## 2022-10-17 ENCOUNTER — Ambulatory Visit
Admission: EM | Admit: 2022-10-17 | Discharge: 2022-10-17 | Disposition: A | Discharge status: Home / Self Care | Payer: Medicaid Other | Attending: Emergency Medicine | Admitting: Emergency Medicine

## 2022-10-17 DIAGNOSIS — G44011 Episodic cluster headache, intractable: Secondary | ICD-10-CM | POA: Diagnosis not present

## 2022-10-17 MED ORDER — KETOROLAC TROMETHAMINE 30 MG/ML IJ SOLN
30.0000 mg | Freq: Once | INTRAMUSCULAR | Status: AC
  Administered 2022-10-17: 30 mg via INTRAMUSCULAR

## 2022-10-17 MED ORDER — PREDNISONE 20 MG PO TABS: 20.0000 mg | ORAL_TABLET | Freq: Every day | ORAL | 0 refills | Status: DC

## 2022-10-17 MED ORDER — SUMATRIPTAN SUCCINATE 100 MG PO TABS: 100.0000 mg | ORAL_TABLET | ORAL | 0 refills | Status: DC | PRN

## 2022-10-17 NOTE — ED Triage Notes (Signed)
Pt c/o L sided HA & pressure x3 days. Was seen on 7/26 for the same issue & was given tessalon,promethazine,albuterol & nasal spray w/o relief. Also tested neg for covid & strep.
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
## 2022-10-19 ENCOUNTER — Other Ambulatory Visit: Payer: Self-pay

## 2022-10-19 ENCOUNTER — Emergency Department: Payer: Medicaid Other

## 2022-10-19 ENCOUNTER — Encounter: Payer: Self-pay | Admitting: Emergency Medicine

## 2022-10-19 ENCOUNTER — Emergency Department
Admission: EM | Admit: 2022-10-19 | Discharge: 2022-10-19 | Disposition: A | Discharge status: Home / Self Care | Payer: Medicaid Other | Attending: Emergency Medicine | Admitting: Emergency Medicine

## 2022-10-19 DIAGNOSIS — R519 Headache, unspecified: Secondary | ICD-10-CM | POA: Diagnosis present

## 2022-10-19 DIAGNOSIS — G44019 Episodic cluster headache, not intractable: Secondary | ICD-10-CM | POA: Diagnosis not present

## 2022-10-19 LAB — BASIC METABOLIC PANEL
Anion gap: 6 (ref 5–15)
BUN: 12 mg/dL (ref 6–20)
CO2: 28 mmol/L (ref 22–32)
Calcium: 9 mg/dL (ref 8.9–10.3)
Chloride: 106 mmol/L (ref 98–111)
Creatinine, Ser: 0.69 mg/dL (ref 0.44–1.00)
GFR, Estimated: >60 mL/min (ref >60)
Glucose, Bld: 105 mg/dL — ABNORMAL HIGH (ref 70–99)
Potassium: 3.6 mmol/L (ref 3.5–5.1)
Sodium: 140 mmol/L (ref 135–145)

## 2022-10-19 LAB — CBC WITH DIFFERENTIAL/PLATELET
Abs Immature Granulocytes: 0.38 10*3/uL — ABNORMAL HIGH (ref 0.00–0.07)
Basophils Absolute: 0 10*3/uL (ref 0.0–0.1)
Basophils Relative: 0 %
Eosinophils Absolute: 0.1 10*3/uL (ref 0.0–0.5)
Eosinophils Relative: 1 %
HCT: 32.7 % — ABNORMAL LOW (ref 36.0–46.0)
Hemoglobin: 10.7 g/dL — ABNORMAL LOW (ref 12.0–15.0)
Immature Granulocytes: 4 %
Lymphocytes Relative: 34 %
Lymphs Abs: 3.5 10*3/uL (ref 0.7–4.0)
MCH: 30.4 pg (ref 26.0–34.0)
MCHC: 32.7 g/dL (ref 30.0–36.0)
MCV: 92.9 fL (ref 80.0–100.0)
Monocytes Absolute: 0.8 10*3/uL (ref 0.1–1.0)
Monocytes Relative: 8 %
Neutro Abs: 5.5 10*3/uL (ref 1.7–7.7)
Neutrophils Relative %: 53 %
Platelets: 340 10*3/uL (ref 150–400)
RBC: 3.52 MIL/uL — ABNORMAL LOW (ref 3.87–5.11)
RDW: 13.1 % (ref 11.5–15.5)
WBC: 10.3 10*3/uL (ref 4.0–10.5)
nRBC: 0 % (ref 0.0–0.2)

## 2022-10-19 MED ORDER — KETOROLAC TROMETHAMINE 30 MG/ML IJ SOLN
15.0000 mg | Freq: Once | INTRAMUSCULAR | Status: AC
  Administered 2022-10-19: 15 mg via INTRAVENOUS
  Filled 2022-10-19: qty 1

## 2022-10-19 MED ORDER — GADOBUTROL 1 MMOL/ML IV SOLN
7.0000 mL | Freq: Once | INTRAVENOUS | Status: AC | PRN
  Administered 2022-10-19: 7 mL via INTRAVENOUS

## 2022-10-19 MED ORDER — DEXAMETHASONE SODIUM PHOSPHATE 10 MG/ML IJ SOLN
10.0000 mg | Freq: Once | INTRAMUSCULAR | Status: AC
  Administered 2022-10-19: 10 mg via INTRAVENOUS
  Filled 2022-10-19: qty 1

## 2022-10-19 MED ORDER — SODIUM CHLORIDE 0.9 % IV BOLUS
500.0000 mL | Freq: Once | INTRAVENOUS | Status: AC
  Administered 2022-10-19: 500 mL via INTRAVENOUS

## 2022-10-19 MED ORDER — DROPERIDOL 2.5 MG/ML IJ SOLN
2.5000 mg | Freq: Once | INTRAMUSCULAR | Status: AC
  Administered 2022-10-19: 2.5 mg via INTRAVENOUS
  Filled 2022-10-19: qty 2

## 2022-10-19 MED ORDER — DIPHENHYDRAMINE HCL 50 MG/ML IJ SOLN
12.5000 mg | INTRAMUSCULAR | Status: AC
  Administered 2022-10-19: 12.5 mg via INTRAVENOUS
  Filled 2022-10-19: qty 1

## 2022-10-19 NOTE — Discharge Instructions (Signed)

## 2022-10-19 NOTE — ED Triage Notes (Signed)
Patient ambulatory to triage with steady gait, without difficulty or distress noted; pt reports since Sunday having left sided HA with no accomp symptoms; seen at [facility] on Monday and was dx with cluster HA (no hx of such); st pain relieved after meds and O2 therapy but tonight HA returned

## 2022-10-19 NOTE — ED Provider Notes (Signed)
[facility] Provider Note    Event Date/Time   First MD Initiated Contact with Patient 10/19/22 0148     (approximate)   History   Headache   HPI Kelly Roberts is a 32 y.o. female who presents for evaluation of a severe left-sided headache.  She has no history of headaches before the last few days.  She says she never had any trouble until she was pregnant and gave birth a few months ago.  She started having headaches recently and notes that when they started on about 4 days ago, she was having a feeling of sinus congestion and runny nose as well as a feeling of fullness in the left side of her face and head with severe pain.  She was seen at an urgent care and was diagnosed with a cluster headache.  The symptoms returned and she states that she went back to the urgent care and was treated again for cluster headaches and felt better after medications and oxygen.  She was feeling better and even went to work today but then after she got home the pain started again and it is unbearable.  She again feels a feeling of fullness and pressure in her sinuses as well as the pain in her face.  She is having no visual changes and no numbness nor tingling in her extremities.  She has not passed out.  The pain is primarily left-sided but it does radiate throughout her head.  No neck stiffness or pain.  No fever.  Some nausea.     Physical Exam   Triage Vital Signs: ED Triage Vitals  Encounter Vitals Group     BP 10/19/22 0056 (!) 149/92     Systolic BP Percentile --      Diastolic BP Percentile --      Pulse Rate 10/19/22 0056 76     Resp 10/19/22 0056 15     Temp 10/19/22 0056 98.2 F (36.8 C)     Temp Source 10/19/22 0056 Oral     SpO2 10/19/22 0056 98 %     Weight 10/19/22 0043 63.5 kg (140 lb)     Height 10/19/22 0043 1.422 m (4\' 8" )     Head Circumference --      Peak Flow --      Pain Score 10/19/22 0043 10     Pain Loc --      Pain Education --      Exclude  from Growth Chart --     Most recent vital signs: Vitals:   10/19/22 0056 10/19/22 0400  BP: (!) 149/92 129/80  Pulse: 76 66  Resp: 15 15  Temp: 98.2 F (36.8 C)   SpO2: 98% 93%    General: Awake, patient is in distress and in obvious pain. Eyes:  Pupils equal and reactive but patient has photophobia and wants me to keep the lights off. CV:  Good peripheral perfusion.  Regular rate and rhythm. Resp:  Normal effort. Speaking easily and comfortably, no accessory muscle usage nor intercostal retractions.   Abd:  No distention.  Other:  No focal neurological deficits appreciated.  Patient is very uncomfortable and upset, frustrated as well as in pain.   ED Results / Procedures / Treatments   Labs (all labs ordered are listed, but only abnormal results are displayed) Labs Reviewed  BASIC METABOLIC PANEL - Abnormal; Notable for the following components:      Result Value   Glucose, Bld 105 (*)  All other components within normal limits  CBC WITH DIFFERENTIAL/PLATELET - Abnormal; Notable for the following components:   RBC 3.52 (*)    Hemoglobin 10.7 (*)    HCT 32.7 (*)    Abs Immature Granulocytes 0.38 (*)    All other components within normal limits      RADIOLOGY Reviewed and interpreted patient's MRI and MRV --see hospital course for details.   PROCEDURES:  Critical Care performed: No  Procedures    IMPRESSION / MDM / ASSESSMENT AND PLAN / ED COURSE  I reviewed the triage vital signs and the nursing notes.                              Differential diagnosis includes, but is not limited to, cluster headache, tension headache, migraine, sinus venous thrombosis, idiopathic intracranial hypertension, PRES  Patient's presentation is most consistent with acute presentation with potential threat to life or bodily function.  Labs/studies ordered: MR brain, MR/MRV head with and without contrast, CBC with differential, BMP  Interventions/Medications given:   Medications  dexamethasone (DECADRON) injection 10 mg (has no administration in time range)  ketorolac (TORADOL) 30 MG/ML injection 15 mg (15 mg Intravenous Given 10/19/22 0226)  droperidol (INAPSINE) 2.5 MG/ML injection 2.5 mg (2.5 mg Intravenous Given 10/19/22 0227)  diphenhydrAMINE (BENADRYL) injection 12.5 mg (12.5 mg Intravenous Given 10/19/22 0227)  sodium chloride 0.9 % bolus 500 mL (0 mLs Intravenous Stopped 10/19/22 0400)  gadobutrol (GADAVIST) 1 MMOL/ML injection 7 mL (7 mLs Intravenous Contrast Given 10/19/22 0301)    (Note:  hospital course my include additional interventions and/or labs/studies not listed above.)   Vital signs are essentially normal.  I am concerned about the patient's recent pregnancy, now having symptoms throughout her face/sinuses and severe headaches with no prior history.  Concern for the possibility of thromboembolism/sinus thrombosis.  I am treating for migraine with medications/fluids as described above, but I will proceed with MR brain and MR/MRV with and without contrast to rule out a potentially life-threatening condition.  I explained this to the patient and she understands and agrees.     Clinical Course as of 10/19/22 0458  Wed Oct 19, 2022  1610 Basic metabolic panel and CBC are both reassuring and within normal limits [CF]  0418 MR BRAIN WO CONTRAST I viewed and interpreted the patient's brain MRI as well as the MRV.  I see no evidence of filling defects or acute abnormalities.  The radiologist confirmed normal MRIs/MRV which is reassuring. [CF]  0421 Patient is sleeping very comfortably but woke up easily and told me that her headache is completely gone.  She is understandably worried that is going to come back but I told her that it may but that fortunately she does not have any acute intracranial issue going on.  I will refer her to PCP as well as giving her information about neurology follow-up.  She will need to sleep little bit more right now  after the droperidol but she agrees with the plan. [CF]    Clinical Course User Index [CF] Loleta Rose, MD     FINAL CLINICAL IMPRESSION(S) / ED DIAGNOSES   Final diagnoses:  Episodic cluster headache, not intractable     Rx / DC Orders   ED Discharge Orders          Ordered    Ambulatory Referral to Primary Care (Establish Care)        10/19/22 0423  Note:  This document was prepared using Dragon voice recognition software and may include unintentional dictation errors.   Loleta Rose, MD 10/19/22 [phone]

## 2022-10-22 ENCOUNTER — Ambulatory Visit
Admission: EM | Admit: 2022-10-22 | Discharge: 2022-10-22 | Disposition: A | Discharge status: Home / Self Care | Payer: Medicaid Other | Attending: Internal Medicine | Admitting: Internal Medicine

## 2022-10-22 DIAGNOSIS — G8929 Other chronic pain: Secondary | ICD-10-CM | POA: Diagnosis not present

## 2022-10-22 DIAGNOSIS — R519 Headache, unspecified: Secondary | ICD-10-CM

## 2022-10-22 MED ORDER — PROMETHAZINE HCL 25 MG PO TABS: 25.0000 mg | ORAL_TABLET | Freq: Four times a day (QID) | ORAL | 0 refills | Status: DC | PRN

## 2022-10-22 MED ORDER — BUTALBITAL-APAP-CAFFEINE 50-325-40 MG PO TABS: 1.0000 | ORAL_TABLET | Freq: Four times a day (QID) | ORAL | 0 refills | Status: DC | PRN

## 2022-10-22 MED ORDER — ONDANSETRON 8 MG PO TBDP
8.0000 mg | ORAL_TABLET | Freq: Once | ORAL | Status: AC
  Administered 2022-10-22: 8 mg via ORAL
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
## 2022-10-22 NOTE — Discharge Instructions (Signed)
Follow up with your PCP as scheduled next week.

## 2022-10-22 NOTE — ED Triage Notes (Signed)
Patient states that she is still having headaches. Was seen last week for the same thing. She took the meds that was given that helped some. She states that she has took anything was Excedrin migraine during the night.

## 2022-10-26 ENCOUNTER — Ambulatory Visit (INDEPENDENT_AMBULATORY_CARE_PROVIDER_SITE_OTHER): Payer: Medicaid Other | Admitting: Family Medicine

## 2022-10-26 ENCOUNTER — Encounter: Payer: Self-pay | Admitting: Family Medicine

## 2022-10-26 VITALS — BP 110/68 | HR 76 | Temp 97.7°F | Resp 16 | Ht <= 58 in | Wt 158.0 lb

## 2022-10-26 DIAGNOSIS — E559 Vitamin D deficiency, unspecified: Secondary | ICD-10-CM

## 2022-10-26 DIAGNOSIS — E669 Obesity, unspecified: Secondary | ICD-10-CM | POA: Diagnosis not present

## 2022-10-26 DIAGNOSIS — D509 Iron deficiency anemia, unspecified: Secondary | ICD-10-CM

## 2022-10-26 DIAGNOSIS — G43009 Migraine without aura, not intractable, without status migrainosus: Secondary | ICD-10-CM

## 2022-10-26 DIAGNOSIS — Z6835 Body mass index (BMI) 35.0-35.9, adult: Secondary | ICD-10-CM

## 2022-10-26 LAB — TSH: TSH: 0.43 u[IU]/mL (ref 0.35–5.50)

## 2022-10-26 LAB — CBC
HCT: 34.2 % — ABNORMAL LOW (ref 36.0–46.0)
Hemoglobin: 11.2 g/dL — ABNORMAL LOW (ref 12.0–15.0)
MCHC: 32.6 g/dL (ref 30.0–36.0)
MCV: 93.6 fl (ref 78.0–100.0)
Platelets: 362 10*3/uL (ref 150.0–400.0)
RBC: 3.66 Mil/uL — ABNORMAL LOW (ref 3.87–5.11)
RDW: 14.4 % (ref 11.5–15.5)
WBC: 7.6 10*3/uL (ref 4.0–10.5)

## 2022-10-26 LAB — IBC + FERRITIN
Ferritin: 919.7 ng/mL — ABNORMAL HIGH (ref 10.0–291.0)
Iron: 89 ug/dL (ref 42–145)
Saturation Ratios: 35.9 % (ref 20.0–50.0)
TIBC: 247.8 ug/dL — ABNORMAL LOW (ref 250.0–450.0)
Transferrin: 177 mg/dL — ABNORMAL LOW (ref 212.0–360.0)

## 2022-10-26 LAB — HEMOGLOBIN A1C: Hgb A1c MFr Bld: 5 % (ref 4.6–6.5)

## 2022-10-26 LAB — VITAMIN B12: Vitamin B-12: 523 pg/mL (ref 211–911)

## 2022-10-26 LAB — VITAMIN D 25 HYDROXY (VIT D DEFICIENCY, FRACTURES): VITD: 7.63 ng/mL — ABNORMAL LOW (ref 30.00–100.00)

## 2022-10-26 LAB — SEDIMENTATION RATE: Sed Rate: 28 mm/hr — ABNORMAL HIGH (ref 0–20)

## 2022-10-26 MED ORDER — RIZATRIPTAN BENZOATE 10 MG PO TABS
10.0000 mg | ORAL_TABLET | ORAL | 0 refills | Status: DC | PRN
Start: 2022-10-26 — End: 2023-06-21

## 2022-10-26 NOTE — Patient Instructions (Signed)
It was a pleasure meeting you today. Thank you for allowing me to take part in your health care.  Our goals for today as we discussed include:  Maxalt for abortive therapy  Use Fioricet as needed  Increase water intake  Start iron supplements Hemoglobin is low  Check blood work today   If you have any questions or concerns, please do not hesitate to call the office at [phone].  I look forward to our next visit and until then take care and stay safe.  Regards,   Dana Allan, MD   [facility]

## 2022-10-26 NOTE — Progress Notes (Unsigned)
SUBJECTIVE:   Chief Complaint  Patient presents with  . Establish Care  . Headache    X 1 week been to [facility] 4 time since then. Left side hurts from head down to the throat. Nose burning.  . Emesis    Everyday since the headache   HPI Presents to clinic to establish care  Headaches Daily since July 23. Unilateral, left side, associated with throbbing pain in left eye and tearing.  Endorses photophobia, nausea without vomiting.  Pain is intense and has been missing work.  Symptoms started around time of IUD insertion.     PERTINENT PMH / PSH: ***  OBJECTIVE:  BP 110/68   Pulse 76   Temp 97.7 F (36.5 C)   Resp 16   Ht 4\' 8"  (1.422 m)   Wt 158 lb (71.7 kg)   LMP 10/05/2022   SpO2 96%   BMI 35.42 kg/m    Physical Exam Vitals reviewed.  Constitutional:      General: She is not in acute distress.    Appearance: She is obese. She is not ill-appearing or toxic-appearing.  HENT:     Head: Normocephalic.     Right Ear: Tympanic membrane, ear canal and external ear normal.     Left Ear: Tympanic membrane, ear canal and external ear normal.     Nose: Nose normal.     Mouth/Throat:     Mouth: Mucous membranes are moist.  Eyes:     Extraocular Movements: Extraocular movements intact.     Right eye: Normal extraocular motion and no nystagmus.     Left eye: Normal extraocular motion and no nystagmus.     Conjunctiva/sclera: Conjunctivae normal.     Pupils: Pupils are equal, round, and reactive to light.  Neck:     Thyroid: No thyromegaly or thyroid tenderness.     Vascular: No carotid bruit.  Cardiovascular:     Rate and Rhythm: Normal rate and regular rhythm.     Pulses: Normal pulses.     Heart sounds: Normal heart sounds.  Pulmonary:     Effort: Pulmonary effort is normal.     Breath sounds: Normal breath sounds.  Abdominal:     General: Bowel sounds are normal. There is no distension.     Palpations: Abdomen is soft.     Tenderness: There is no abdominal  tenderness. There is no right CVA tenderness, left CVA tenderness, guarding or rebound.  Musculoskeletal:        General: Normal range of motion.     Cervical back: Normal range of motion and neck supple. No rigidity.     Right lower leg: No edema.     Left lower leg: No edema.  Lymphadenopathy:     Cervical: No cervical adenopathy.  Skin:    Capillary Refill: Capillary refill takes less than 2 seconds.  Neurological:     General: No focal deficit present.     Mental Status: She is alert and oriented to person, place, and time. Mental status is at baseline.     Sensory: No sensory deficit.     Motor: No weakness.     Coordination: Coordination normal.     Gait: Gait normal.  Psychiatric:        Mood and Affect: Mood normal.        Behavior: Behavior normal.        Thought Content: Thought content normal.        Judgment: Judgment normal.  10/26/2022    9:08 AM 08/17/2021    3:07 PM 07/08/2020   10:19 AM  Depression screen PHQ 2/9  Decreased Interest 1 0 0  Down, Depressed, Hopeless 1 0 0  PHQ - 2 Score 2 0 0  Altered sleeping 1    Tired, decreased energy 1    Change in appetite 3    Feeling bad or failure about yourself  0    Trouble concentrating 3    Moving slowly or fidgety/restless 0    Suicidal thoughts 0    PHQ-9 Score 10    Difficult doing work/chores Very difficult        10/26/2022    9:09 AM  GAD 7 : Generalized Anxiety Score  Nervous, Anxious, on Edge 0  Control/stop worrying 0  Worry too much - different things 0  Trouble relaxing 0  Restless 0  Easily annoyed or irritable 0  Afraid - awful might happen 0  Total GAD 7 Score 0  Anxiety Difficulty Not difficult at all    ASSESSMENT/PLAN:  Migraine without aura and without status migrainosus, not intractable -     Sedimentation rate -     Rizatriptan Benzoate; Take 1 tablet (10 mg total) by mouth as needed for migraine. May repeat in 2 hours if needed  Dispense: 10 tablet; Refill: 0  Iron  deficiency anemia, unspecified iron deficiency anemia type -     IBC + Ferritin -     CBC  Obesity (BMI 35.0-39.9 without comorbidity) -     TSH -     Hemoglobin A1c -     VITAMIN D 25 Hydroxy (Vit-D Deficiency, Fractures) -     Vitamin B12   PDMP reviewed  Return if symptoms worsen or fail to improve, for PCP.  Dana Allan, MD

## 2022-11-07 ENCOUNTER — Encounter: Payer: Self-pay | Admitting: Family Medicine

## 2022-11-07 DIAGNOSIS — E669 Obesity, unspecified: Secondary | ICD-10-CM | POA: Insufficient documentation

## 2022-11-07 DIAGNOSIS — D509 Iron deficiency anemia, unspecified: Secondary | ICD-10-CM | POA: Insufficient documentation

## 2022-11-07 DIAGNOSIS — E559 Vitamin D deficiency, unspecified: Secondary | ICD-10-CM | POA: Insufficient documentation

## 2022-11-07 DIAGNOSIS — G43009 Migraine without aura, not intractable, without status migrainosus: Secondary | ICD-10-CM | POA: Insufficient documentation

## 2022-11-07 MED ORDER — VITAMIN D (ERGOCALCIFEROL) 1.25 MG (50000 UNIT) PO CAPS
50000.0000 [IU] | ORAL_CAPSULE | ORAL | 1 refills | Status: DC
Start: 2022-11-07 — End: 2023-08-30

## 2022-11-07 NOTE — Assessment & Plan Note (Signed)
Recent Hbg low.  Likely secondary to delivery  Check CBC and Ferritin

## 2022-11-07 NOTE — Assessment & Plan Note (Addendum)
Ongoing unilateral headache with photophobia and nausea No focal deficits on neuro exam Recommend limited use of Fioricet given possible rebound headaches Trial Maxalt 10 mg with onset of migraine.  Max 2 tabs in 24 hrs Increase fluids Limit processed foods Ensure at least 6-8 hours sleep Continue phenergan for nausea Could consider Nurtec if no improvement at next visit Check CBC, Ferritin, ESR Important to keep scheduled Neurology appointment in Oct

## 2022-11-07 NOTE — Assessment & Plan Note (Signed)
Chronic Check obesity labs

## 2022-11-07 NOTE — Assessment & Plan Note (Signed)
Vitamin D low Start Vitamin D 1.25 mg weekly

## 2022-11-08 ENCOUNTER — Telehealth: Payer: Self-pay | Admitting: Family Medicine

## 2022-11-08 NOTE — Telephone Encounter (Signed)
Patient need lab orders.

## 2022-11-10 ENCOUNTER — Other Ambulatory Visit (INDEPENDENT_AMBULATORY_CARE_PROVIDER_SITE_OTHER): Payer: Medicaid Other

## 2022-11-10 ENCOUNTER — Other Ambulatory Visit: Payer: Self-pay | Admitting: Family Medicine

## 2022-11-10 DIAGNOSIS — D649 Anemia, unspecified: Secondary | ICD-10-CM | POA: Diagnosis not present

## 2022-11-10 DIAGNOSIS — R7989 Other specified abnormal findings of blood chemistry: Secondary | ICD-10-CM

## 2022-11-10 DIAGNOSIS — R790 Abnormal level of blood mineral: Secondary | ICD-10-CM | POA: Diagnosis not present

## 2022-11-11 ENCOUNTER — Telehealth: Payer: Self-pay | Admitting: Family Medicine

## 2022-11-11 LAB — CBC WITH DIFFERENTIAL/PLATELET
Basophils Absolute: 0.1 10*3/uL (ref 0.0–0.1)
Basophils Relative: 1 % (ref 0.0–3.0)
Eosinophils Absolute: 0.2 10*3/uL (ref 0.0–0.7)
Eosinophils Relative: 3.7 % (ref 0.0–5.0)
HCT: 33.8 % — ABNORMAL LOW (ref 36.0–46.0)
Hemoglobin: 11.3 g/dL — ABNORMAL LOW (ref 12.0–15.0)
Lymphocytes Relative: 33.4 % (ref 12.0–46.0)
Lymphs Abs: 2 10*3/uL (ref 0.7–4.0)
MCHC: 33.6 g/dL (ref 30.0–36.0)
MCV: 93.1 fl (ref 78.0–100.0)
Monocytes Absolute: 0.4 10*3/uL (ref 0.1–1.0)
Monocytes Relative: 7 % (ref 3.0–12.0)
Neutro Abs: 3.2 10*3/uL (ref 1.4–7.7)
Neutrophils Relative %: 54.9 % (ref 43.0–77.0)
Platelets: 277 10*3/uL (ref 150.0–400.0)
RBC: 3.63 Mil/uL — ABNORMAL LOW (ref 3.87–5.11)
RDW: 14.5 % (ref 11.5–15.5)
WBC: 5.9 10*3/uL (ref 4.0–10.5)

## 2022-11-11 LAB — FERRITIN: Ferritin: 609.7 ng/mL — ABNORMAL HIGH (ref 10.0–291.0)

## 2022-11-11 NOTE — Telephone Encounter (Signed)
Orders were placed after patient left. I only ordered a CBC & Ferritin based on the last lab note from 11/07/22.

## 2022-11-11 NOTE — Addendum Note (Signed)
Addended by: Jarvis Morgan D on: 11/11/2022 01:12 PM   Modules accepted: Orders

## 2022-11-16 ENCOUNTER — Encounter: Payer: Self-pay | Admitting: Family Medicine

## 2022-11-16 ENCOUNTER — Ambulatory Visit (INDEPENDENT_AMBULATORY_CARE_PROVIDER_SITE_OTHER): Payer: Medicaid Other | Admitting: Family Medicine

## 2022-11-16 VITALS — BP 112/64 | HR 85 | Temp 97.7°F | Resp 16 | Ht <= 58 in | Wt 164.0 lb

## 2022-11-16 DIAGNOSIS — R519 Headache, unspecified: Secondary | ICD-10-CM

## 2022-11-16 DIAGNOSIS — R7989 Other specified abnormal findings of blood chemistry: Secondary | ICD-10-CM

## 2022-11-16 DIAGNOSIS — G43009 Migraine without aura, not intractable, without status migrainosus: Secondary | ICD-10-CM

## 2022-11-16 MED ORDER — PREDNISONE 50 MG PO TABS: ORAL_TABLET | ORAL | 0 refills | Status: DC

## 2022-11-16 MED ORDER — PREDNISONE 20 MG PO TABS
20.0000 mg | ORAL_TABLET | Freq: Every day | ORAL | 0 refills | Status: AC
Start: 2022-11-16 — End: 2022-11-21

## 2022-11-16 MED ORDER — NURTEC 75 MG PO TBDP: 1.0000 | ORAL_TABLET | Freq: Every day | ORAL | Status: DC | PRN

## 2022-11-16 MED ORDER — NURTEC 75 MG PO TBDP
75.0000 mg | ORAL_TABLET | Freq: Every day | ORAL | 0 refills | Status: DC | PRN
Start: 2022-11-16 — End: 2023-06-21

## 2022-11-16 NOTE — Patient Instructions (Addendum)
It was a pleasure meeting you today. Thank you for allowing me to take part in your health care.  Our goals for today as we discussed include:  Start Prednisone 20 mg daily for 5 days Notify MD if no change in headache and will order different medication  Nurtec 75 mg as needed at onset of headache. Max use in 24 hrs  Follow up with Neurology as scheduled  Left ear was flushed today.  If you have any increased pain, discharge, or fevers please notify MD   Follow up in 4 weeks  If you have any questions or concerns, please do not hesitate to call the office at [phone].  I look forward to our next visit and until then take care and stay safe.  Regards,   Dana Allan, MD   [facility]

## 2022-11-16 NOTE — Progress Notes (Signed)
SUBJECTIVE:   Chief Complaint  Patient presents with   Abnormal Lab   HPI Presents to clinic to discuss blood work  Elevated Ferritin Patient denies any family history of liver disease, hemochromatosis, or blood disorders.  Id not currently taking iron supplements.  Denies any RUQ abdominal pain, nausea, vomiting or fevers.  Migraines Continues to have daily headaches.  Taking Fioricet regularly for headaches. Has been most effective in pain relief. Tried Imitrex and Maxalt, reports was not effective. Has Neurology appointment scheduled for 10/02.     PERTINENT PMH / PSH: Headaches/Migraines Obesity   OBJECTIVE:  BP 112/64   Pulse 85   Temp 97.7 F (36.5 C)   Resp 16   Ht 4\' 8"  (1.422 m)   Wt 164 lb (74.4 kg)   LMP 10/05/2022   SpO2 98%   BMI 36.77 kg/m    Physical Exam Constitutional:      General: She is not in acute distress.    Appearance: She is normal weight. She is not ill-appearing.  HENT:     Head: Normocephalic.  Eyes:     Conjunctiva/sclera: Conjunctivae normal.  Cardiovascular:     Rate and Rhythm: Normal rate and regular rhythm.     Pulses: Normal pulses.  Pulmonary:     Effort: Pulmonary effort is normal.     Breath sounds: Normal breath sounds.  Abdominal:     General: Bowel sounds are normal.  Neurological:     Mental Status: She is alert. Mental status is at baseline.  Psychiatric:        Mood and Affect: Mood normal.        Behavior: Behavior normal.        Thought Content: Thought content normal.        Judgment: Judgment normal.        10/26/2022    9:08 AM 08/17/2021    3:07 PM 07/08/2020   10:19 AM  Depression screen PHQ 2/9  Decreased Interest 1 0 0  Down, Depressed, Hopeless 1 0 0  PHQ - 2 Score 2 0 0  Altered sleeping 1    Tired, decreased energy 1    Change in appetite 3    Feeling bad or failure about yourself  0    Trouble concentrating 3    Moving slowly or fidgety/restless 0    Suicidal thoughts 0    PHQ-9  Score 10    Difficult doing work/chores Very difficult        10/26/2022    9:09 AM  GAD 7 : Generalized Anxiety Score  Nervous, Anxious, on Edge 0  Control/stop worrying 0  Worry too much - different things 0  Trouble relaxing 0  Restless 0  Easily annoyed or irritable 0  Afraid - awful might happen 0  Total GAD 7 Score 0  Anxiety Difficulty Not difficult at all    ASSESSMENT/PLAN:  Left temporal headache Assessment & Plan: Pain at temporal area. Recent elevated Ferritin.  Differentials include vasculitis, migraines, cluster headaches.  Low suspicion for infectious etiology given no fevers. No visual disturbances today Check CRP, ESR today Start Prednisone 20 mg daily x 5 days Trial Nurtec 75 mg as needed Discussed importance to keep neurology appointment for evaluation Follow up in 4 weeks or sooner if symptoms worsen   Orders: -     predniSONE; Take 1 tablet (20 mg total) by mouth daily with breakfast for 5 days.  Dispense: 5 tablet; Refill: 0  Elevated ferritin  Assessment & Plan: No familial history of liver disease Repeat Ferritin Check PT/INR , Crp, ESR  Orders: -     Iron, TIBC and Ferritin Panel -     APTT -     Protime-INR -     C-reactive protein  Migraine without aura and without status migrainosus, not intractable -     Nurtec; Take 1 tablet (75 mg total) by mouth daily as needed.  Dispense: 10 tablet; Refill: 0  Other orders -     Nurtec; Place 1 tablet (75 mg total) under the tongue daily as needed (Migraines).  Dispense: 4 tablet   PDMP reviewed  Return in about 4 weeks (around 12/14/2022), or if symptoms worsen or fail to improve, for PCP.  Dana Allan, MD

## 2022-11-16 NOTE — Addendum Note (Signed)
Addended by: Jarvis Morgan D on: 11/16/2022 03:48 PM   Modules accepted: Orders

## 2022-11-16 NOTE — Addendum Note (Signed)
Addended by: Jarvis Morgan D on: 11/16/2022 03:47 PM   Modules accepted: Orders

## 2022-11-17 LAB — IRON,TIBC AND FERRITIN PANEL
%SAT: 36 % (ref 16–45)
Ferritin: 453 ng/mL — ABNORMAL HIGH (ref 16–154)
Iron: 95 ug/dL (ref 40–190)
TIBC: 264 ug/dL (ref 250–450)

## 2022-11-17 LAB — C-REACTIVE PROTEIN: CRP: 16.2 mg/L — ABNORMAL HIGH (ref <8.0)

## 2022-11-17 LAB — PROTIME-INR
INR: 0.9
Prothrombin Time: 10.2 s (ref 9.0–11.5)

## 2022-11-17 LAB — APTT: aPTT: 31 s (ref 23–32)

## 2022-11-22 ENCOUNTER — Encounter: Payer: Self-pay | Admitting: Family Medicine

## 2022-11-22 ENCOUNTER — Other Ambulatory Visit: Payer: Self-pay | Admitting: Family Medicine

## 2022-11-22 DIAGNOSIS — R7982 Elevated C-reactive protein (CRP): Secondary | ICD-10-CM

## 2022-11-22 DIAGNOSIS — R7989 Other specified abnormal findings of blood chemistry: Secondary | ICD-10-CM

## 2022-11-24 ENCOUNTER — Telehealth: Payer: Self-pay

## 2022-11-24 NOTE — Telephone Encounter (Signed)
Pharmacy Patient Advocate Encounter   Received notification from CoverMyMeds that prior authorization for Nurtec 75MG  dispersible tablets is required/requested.   Insurance verification completed.   The patient is insured through  [employer]  .   Per test claim: PA required; PA submitted to [employer] via CoverMyMeds Key/confirmation #/EOC [id number] Status is pending

## 2022-11-25 NOTE — Telephone Encounter (Signed)
Left message to return call to our office.  

## 2022-11-25 NOTE — Telephone Encounter (Signed)
Pharmacy Patient Advocate Encounter  Received notification from  [employer]  that Prior Authorization for  Nurtec 75MG  dispersible tablets  has been DENIED.  Full denial letter will be uploaded to the media tab. See denial reason below.   PA #/Case ID/Reference #: [id number]   Denial Reason: Beneficiary must NOT have headache frequency ? 15 headache days per month during the prior 6 months

## 2022-11-29 NOTE — Telephone Encounter (Signed)
Opened in error

## 2022-12-06 ENCOUNTER — Encounter: Payer: Self-pay | Admitting: Family Medicine

## 2022-12-06 DIAGNOSIS — R7989 Other specified abnormal findings of blood chemistry: Secondary | ICD-10-CM | POA: Insufficient documentation

## 2022-12-06 DIAGNOSIS — R519 Headache, unspecified: Secondary | ICD-10-CM | POA: Insufficient documentation

## 2022-12-06 NOTE — Assessment & Plan Note (Signed)
No familial history of liver disease Repeat Ferritin Check PT/INR , Crp, ESR

## 2022-12-06 NOTE — Assessment & Plan Note (Signed)
Pain at temporal area. Recent elevated Ferritin.  Differentials include vasculitis, migraines, cluster headaches.  Low suspicion for infectious etiology given no fevers. No visual disturbances today Check CRP, ESR today Start Prednisone 20 mg daily x 5 days Trial Nurtec 75 mg as needed Discussed importance to keep neurology appointment for evaluation Follow up in 4 weeks or sooner if symptoms worsen

## 2022-12-14 ENCOUNTER — Ambulatory Visit: Payer: Medicaid Other | Admitting: Family Medicine

## 2022-12-16 ENCOUNTER — Telehealth (INDEPENDENT_AMBULATORY_CARE_PROVIDER_SITE_OTHER): Payer: Medicaid Other | Admitting: Family Medicine

## 2022-12-16 ENCOUNTER — Encounter: Payer: Self-pay | Admitting: Family Medicine

## 2022-12-16 VITALS — Ht <= 58 in | Wt 164.0 lb

## 2022-12-16 DIAGNOSIS — R519 Headache, unspecified: Secondary | ICD-10-CM

## 2022-12-16 NOTE — Progress Notes (Signed)
Virtual Visit via Video note  I connected with Kelly Roberts on 12/25/22 at 1420 by video and verified that I am speaking with the correct person using two identifiers. Kelly Roberts is currently located at home and  is currently alone during visit. The provider, Dana Allan, MD is located in their office at time of visit.  I discussed the limitations, risks, security and privacy concerns of performing an evaluation and management service by video and the availability of in person appointments. I also discussed with the patient that there may be a patient responsible charge related to this service. The patient expressed understanding and agreed to proceed.  Subjective: PCP: Dana Allan, MD  Chief Complaint  Patient presents with   Headache    4 week follow up    Headache    Follow up for headaches Did not take prednisone as prescribed Took Nurtec and headache resolved.  Has not had recurrence since.  Has not had to use Fioricet since last visit.  Has initial appointment with Neurology in near future and recommend she keep appointment.  ROS: Per HPI  Current Outpatient Medications:    albuterol (VENTOLIN HFA) 108 (90 Base) MCG/ACT inhaler, Inhale 2 puffs into the lungs every 4 (four) hours as needed., Disp: 18 g, Rfl: 0   promethazine (PHENERGAN) 25 MG tablet, Take 1 tablet (25 mg total) by mouth every 6 (six) hours as needed for nausea or vomiting., Disp: 30 tablet, Rfl: 0   Rimegepant Sulfate (NURTEC) 75 MG TBDP, Take 1 tablet (75 mg total) by mouth daily as needed., Disp: 10 tablet, Rfl: 0   Rimegepant Sulfate (NURTEC) 75 MG TBDP, Place 1 tablet (75 mg total) under the tongue daily as needed (Migraines)., Disp: 4 tablet, Rfl:    Vitamin D, Ergocalciferol, (DRISDOL) 1.25 MG (50000 UNIT) CAPS capsule, Take 1 capsule (50,000 Units total) by mouth every 7 (seven) days., Disp: 12 capsule, Rfl: 1   butalbital-acetaminophen-caffeine (FIORICET) 50-325-40 MG tablet, Take 1 tablet by mouth  every 6 (six) hours as needed for headache. (Patient not taking: Reported on 12/16/2022), Disp: 20 tablet, Rfl: 0   rizatriptan (MAXALT) 10 MG tablet, Take 1 tablet (10 mg total) by mouth as needed for migraine. May repeat in 2 hours if needed (Patient not taking: Reported on 12/16/2022), Disp: 10 tablet, Rfl: 0  Observations/Objective: Physical Exam Pulmonary:     Effort: Pulmonary effort is normal.  Neurological:     Mental Status: She is alert and oriented to person, place, and time. Mental status is at baseline.  Psychiatric:        Mood and Affect: Mood normal.        Behavior: Behavior normal.        Thought Content: Thought content normal.        Judgment: Judgment normal.    Assessment and Plan: Left temporal headache Assessment & Plan: Resolved with Nurtec and without use of steroids Less likely vasculitis given normal inflammatory markers Discussed importance to keep neurology appointment for evaluation       Follow Up Instructions: Return if symptoms worsen or fail to improve, for PCP.   I discussed the assessment and treatment plan with the patient. The patient was provided an opportunity to ask questions and all were answered. The patient agreed with the plan and demonstrated an understanding of the instructions.   The patient was advised to call back or seek an in-person evaluation if the symptoms worsen or if the condition fails  to improve as anticipated.  The above assessment and management plan was discussed with the patient. The patient verbalized understanding of and has agreed to the management plan. Patient is aware to call the clinic if symptoms persist or worsen. Patient is aware when to return to the clinic for a follow-up visit. Patient educated on when it is appropriate to go to the emergency department.     Dana Allan, MD

## 2022-12-25 ENCOUNTER — Encounter: Payer: Self-pay | Admitting: Family Medicine

## 2022-12-25 NOTE — Patient Instructions (Signed)
It was a pleasure meeting you today. Thank you for allowing me to take part in your health care.  Our goals for today as we discussed include:   Glad your headaches has resolved  Please follow up with Neurology as scheduled  This is a list of the screening recommended for you and due dates:  Health Maintenance  Topic Date Due   Hepatitis C Screening  Never done   Pap Smear  08/18/2022   Flu Shot  10/20/2022   COVID-19 Vaccine (1 - 2023-24 season) Never done   DTaP/Tdap/Td vaccine (4 - Td or Tdap) 05/13/2032   HPV Vaccine  Completed   HIV Screening  Completed     Follow up as needed  If you have any questions or concerns, please do not hesitate to call the office at [phone].  I look forward to our next visit and until then take care and stay safe.  Regards,   Dana Allan, MD   [facility]

## 2022-12-25 NOTE — Assessment & Plan Note (Signed)
Resolved with Nurtec and without use of steroids Less likely vasculitis given normal inflammatory markers Discussed importance to keep neurology appointment for evaluation

## 2023-01-13 IMAGING — CR DG FOOT COMPLETE 3+V*R*
1 series · 3 of 3 positions shown · non-contrast
Comparison: None.

CLINICAL DATA: foot pain

EXAM:
RIGHT FOOT COMPLETE - 3+ VIEW

[Series 1: dg foot complete right · 0.14mm/px · 3 of 3 slices shown]
[im 1/3]
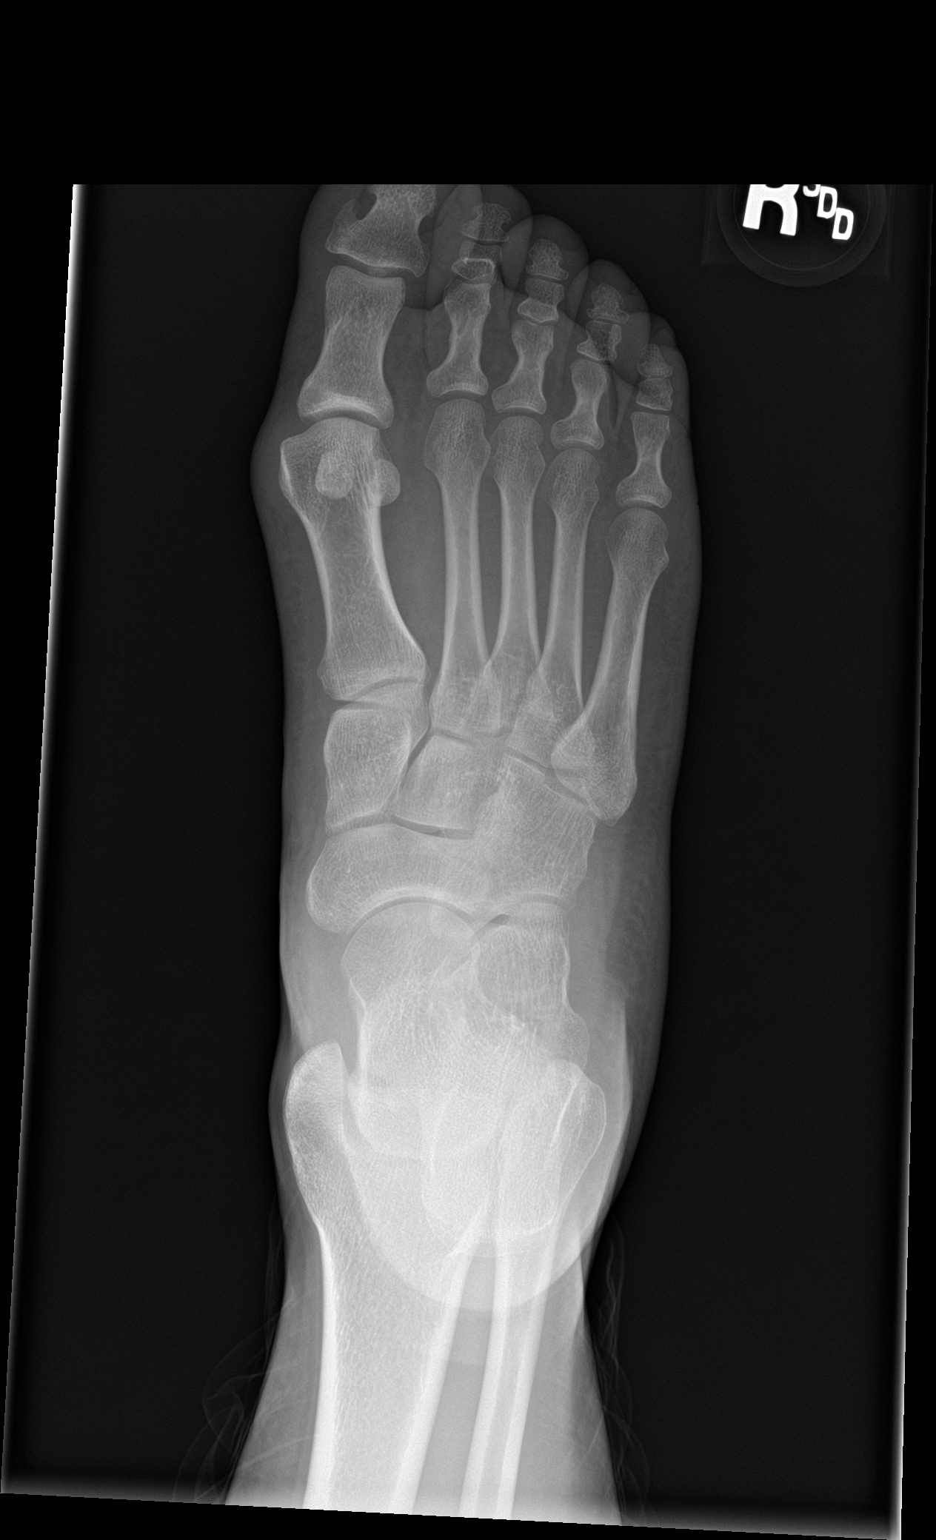
[im 2/3]
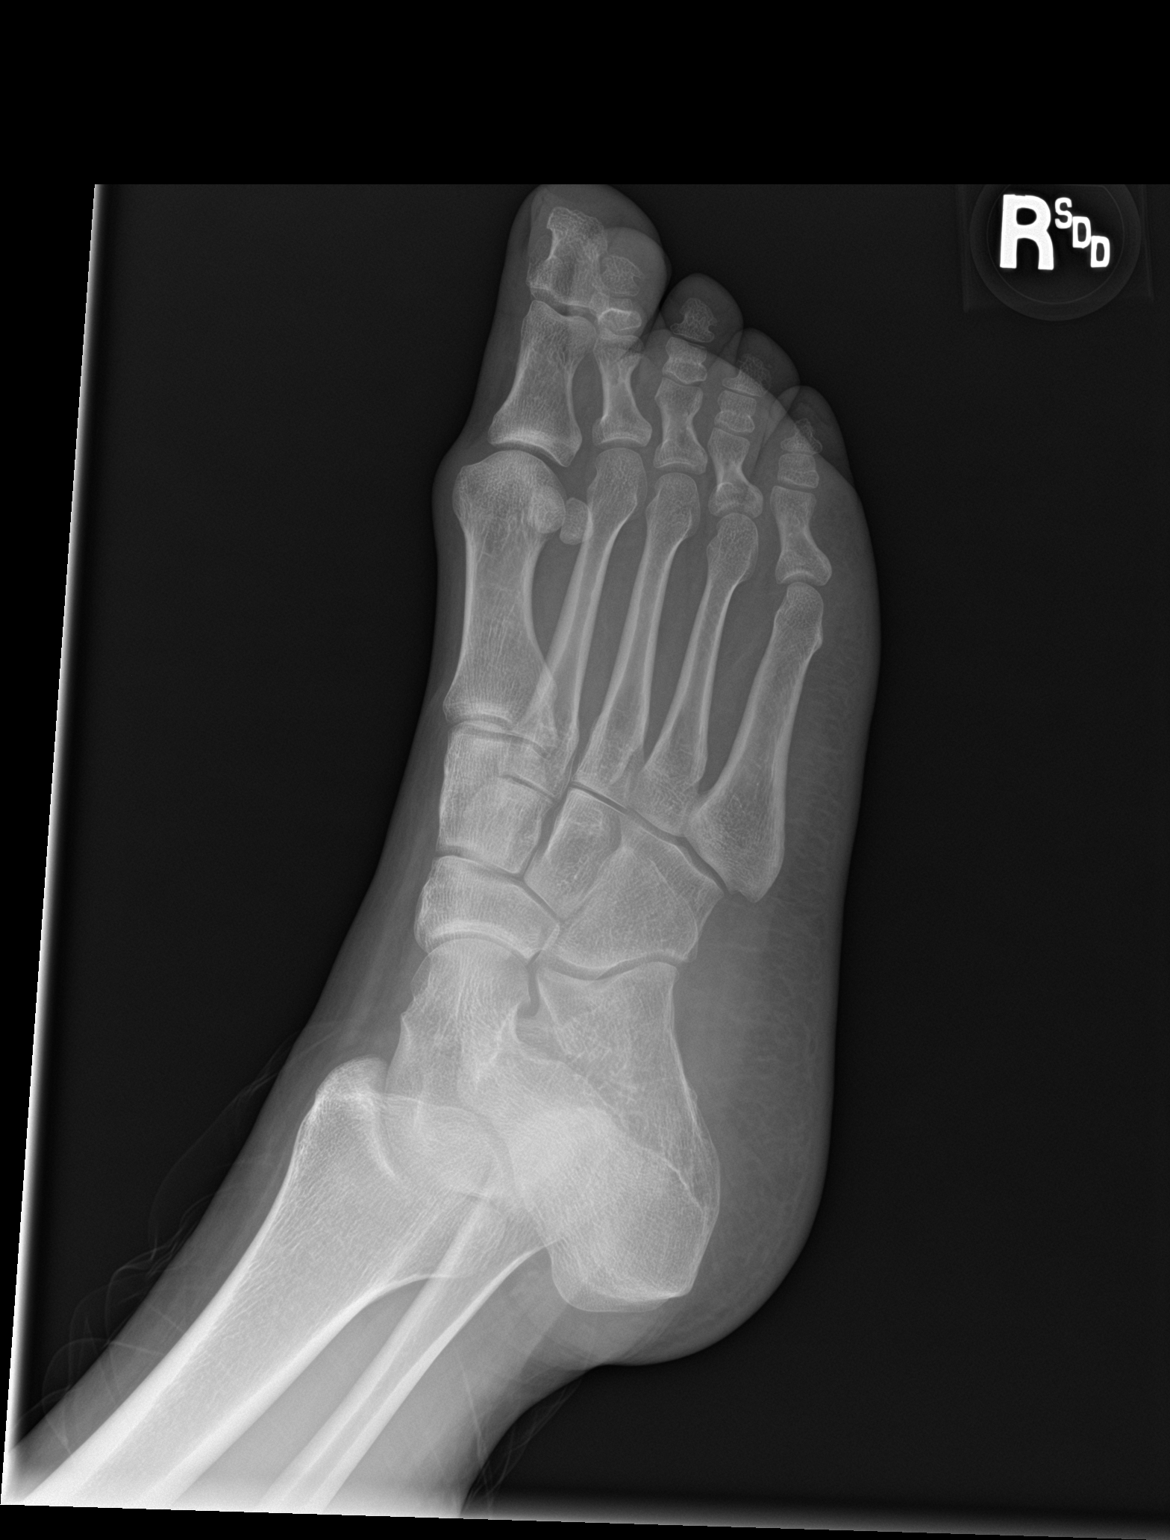
[im 3/3]
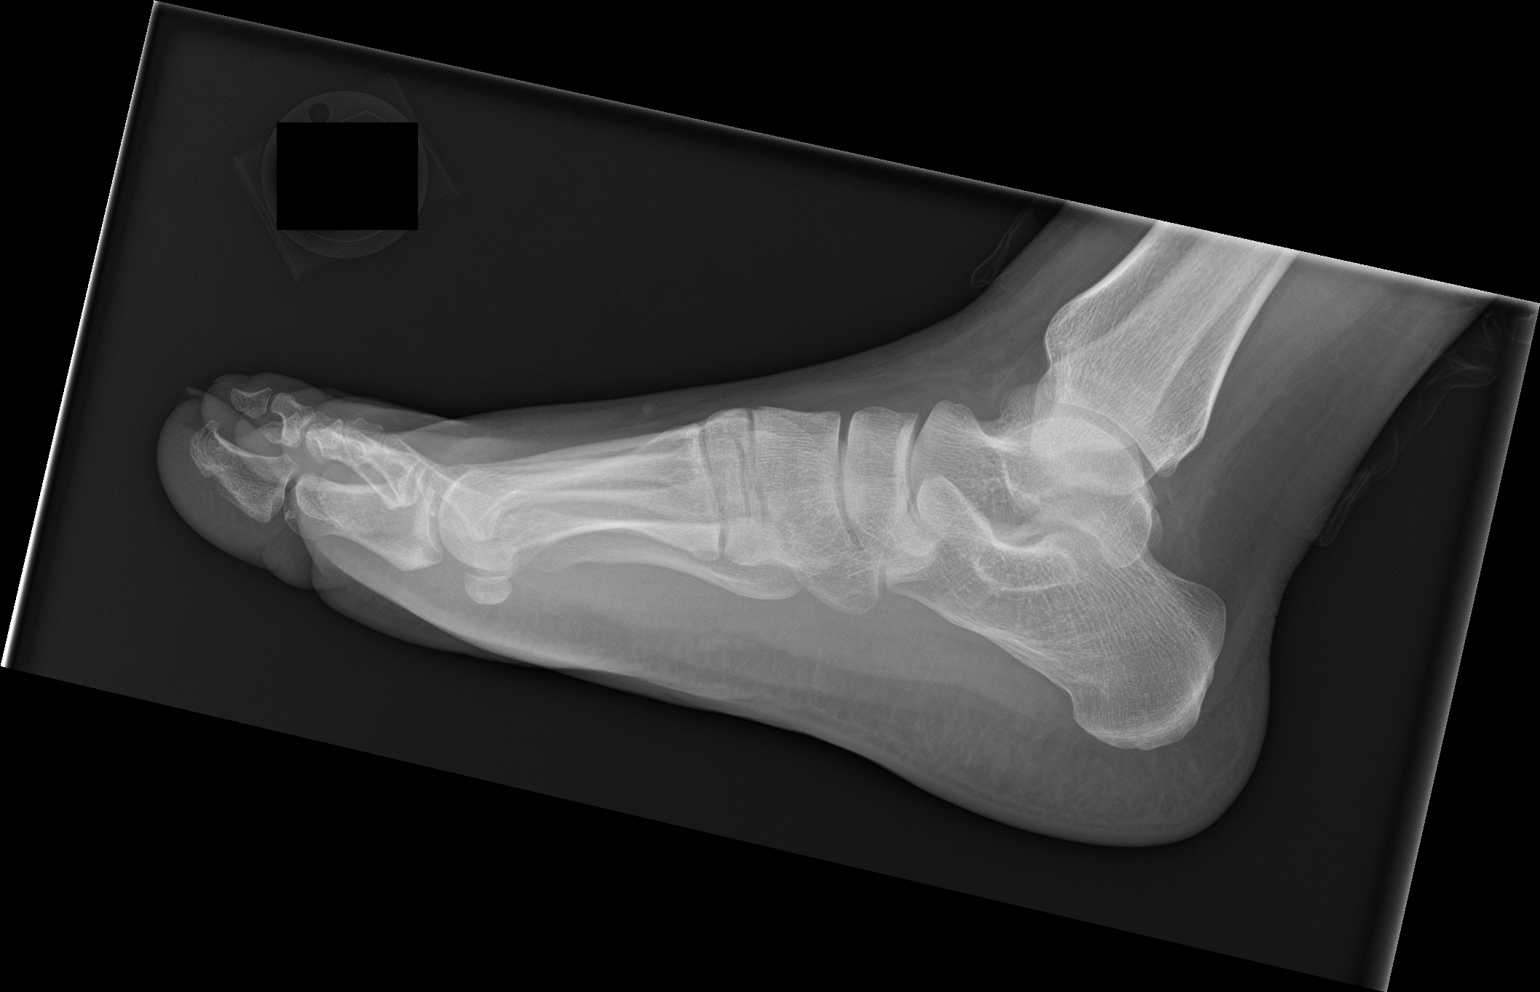

[3 of 3 positions shown; findings below may reference images not displayed]

FINDINGS: There is no evidence of fracture or dislocation. There is no
evidence of arthropathy or other focal bone abnormality. Soft
tissues are unremarkable.
IMPRESSION: No acute displaced fracture or dislocation.

## 2023-06-21 ENCOUNTER — Ambulatory Visit (INDEPENDENT_AMBULATORY_CARE_PROVIDER_SITE_OTHER): Admitting: Family Medicine

## 2023-06-21 ENCOUNTER — Encounter: Payer: Self-pay | Admitting: Family Medicine

## 2023-06-21 VITALS — BP 120/66 | HR 86 | Temp 98.4°F | Resp 20 | Ht <= 58 in | Wt 175.0 lb

## 2023-06-21 DIAGNOSIS — L602 Onychogryphosis: Secondary | ICD-10-CM | POA: Diagnosis not present

## 2023-06-21 DIAGNOSIS — J45909 Unspecified asthma, uncomplicated: Secondary | ICD-10-CM
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
## 2023-06-21 NOTE — Patient Instructions (Addendum)
 It was a pleasure meeting you today. Thank you for allowing me to take part in your health care.  Our goals for today as we discussed include:  Referral sent to Podiatry [facility] [address], [place], [place] [address] Phone: [phone]  Recommend wide based shoes  Referral sent to Pulmonology for spirometry.  You can ask to follow up with a different provider there for a second form to completed. Spirometry testing is not done at this office.    This is a list of the screening recommended for you and due dates:  Health Maintenance  Topic Date Due   Hepatitis C Screening  Never done   Pneumococcal Vaccination (2 of 2 - PCV) 07/10/2015   Pap Smear  08/18/2022   COVID-19 Vaccine (1 - 2024-25 season) Never done   Flu Shot  10/20/2023   DTaP/Tdap/Td vaccine (4 - Td or Tdap) 05/13/2032   HPV Vaccine  Completed   HIV Screening  Completed       If you have any questions or concerns, please do not hesitate to call the office at [phone].  I look forward to our next visit and until then take care and stay safe.  Regards,   Dana Allan, MD   [facility]

## 2023-06-25 ENCOUNTER — Encounter: Payer: Self-pay | Admitting: Family Medicine

## 2023-06-25 DIAGNOSIS — L602 Onychogryphosis: Secondary | ICD-10-CM | POA: Insufficient documentation

## 2023-06-25 NOTE — Assessment & Plan Note (Signed)
 Asthma complicates spirometry testing for car interlock device. Requires 2 providers to complete spirometry testing for DMV.  Currently not offered at this clinic. - Provide a referral to pulmonology for further evaluation related to the interlock device requirement.

## 2023-06-25 NOTE — Assessment & Plan Note (Signed)
 Thickened, painful toenail likely due to previous injury. No fungal infection. Referral to podiatry recommended for specialized care. - Refer to podiatry for nail care - Advise soaking the foot in Epsom salts to alleviate discomfort. - Instruct on proper nail cutting techniques using thick toenail clippers, cutting straight across.

## 2023-06-27 ENCOUNTER — Ambulatory Visit: Admitting: Podiatry

## 2023-06-27 DIAGNOSIS — B351 Tinea unguium: Secondary | ICD-10-CM | POA: Diagnosis not present

## 2023-06-27 DIAGNOSIS — Z79899 Other long term (current) drug therapy: Secondary | ICD-10-CM

## 2023-06-27 NOTE — Progress Notes (Signed)
 Subjective:  Patient ID: Kelly Roberts, female    DOB: May 26, 1990,  MRN: [id number]  Chief Complaint  Patient presents with   Nail Problem    Bilateral hallux nail thick and discolored     33 y.o. female presents with the above complaint.  Patient presents with bilateral hallux hallux thickened and onychodystrophy mycotic nail x 2 pain on palpation hurts with ambulation worse with pressure she would like to discuss treatment options for this.  Pain scale 7 out of 10 dull aching nature.  She wanted to discuss treatment options for her.  She has tried some topical medication, which has helped   Review of Systems: Negative except as noted in the HPI. Denies N/V/F/Ch.  Past Medical History:  Diagnosis Date   Anemia    Asthma    Medical history non-contributory    Mental disorder    Vaginal Pap smear, abnormal     Current Outpatient Medications:    albuterol (VENTOLIN HFA) 108 (90 Base) MCG/ACT inhaler, Inhale 2 puffs into the lungs every 4 (four) hours as needed., Disp: 18 g, Rfl: 0   promethazine (PHENERGAN) 25 MG tablet, Take 1 tablet (25 mg total) by mouth every 6 (six) hours as needed for nausea or vomiting., Disp: 30 tablet, Rfl: 0   topiramate (TOPAMAX) 25 MG tablet, Take 25 mg by mouth daily., Disp: , Rfl:    Vitamin D, Ergocalciferol, (DRISDOL) 1.25 MG (50000 UNIT) CAPS capsule, Take 1 capsule (50,000 Units total) by mouth every 7 (seven) days., Disp: 12 capsule, Rfl: 1  Social History   Tobacco Use  Smoking Status Former   Current packs/day: 0.00   Average packs/day: 0.3 packs/day for 7.0 years (1.8 ttl pk-yrs)   Types: Cigarettes, E-cigarettes   Start date: 01/29/2013   Quit date: 01/30/2020   Years since quitting: 3.4  Smokeless Tobacco Never    Allergies  Allergen Reactions   Lactose Intolerance (Gi)    Objective:  There were no vitals filed for this visit. There is no height or weight on file to calculate BMI. Constitutional Well developed. Well nourished.   Vascular Dorsalis pedis pulses palpable bilaterally. Posterior tibial pulses palpable bilaterally. Capillary refill normal to all digits.  No cyanosis or clubbing noted. Pedal hair growth normal.  Neurologic Normal speech. Oriented to person, place, and time. Epicritic sensation to light touch grossly present bilaterally.  Dermatologic Nails thickened onychodystrophy mycotic nail x 2 bilateral hallux Skin within normal limits  Orthopedic: Normal joint ROM without pain or crepitus bilaterally. No visible deformities. No bony tenderness.   Radiographs: None Assessment:   1. Long-term use of high-risk medication   2. Onychomycosis due to dermatophyte    Plan:  Patient was evaluated and treated and all questions answered.  Bilateral hallux onychomycosis -Educated the patient on the etiology of onychomycosis and various treatment options associated with improving the fungal load.  I explained to the patient that there is 3 treatment options available to treat the onychomycosis including topical, p.o., laser treatment.  Patient elected to undergo p.o. options with Lamisil/terbinafine therapy.  In order for me to start the medication therapy, I explained to the patient the importance of evaluating the liver and obtaining the liver function test.  Once the liver function test comes back normal I will start him on 105-month course of Lamisil therapy.  Patient understood all risk and would like to proceed with Lamisil therapy.  I have asked the patient to immediately stop the Lamisil therapy if she has  any reactions to it and call the office or go to the emergency room right away.  Patient states understanding   No follow-ups on file.

## 2023-06-28 LAB — HEPATIC FUNCTION PANEL
ALT: 10 IU/L (ref 0–32)
AST: 19 IU/L (ref 0–40)
Albumin: 4.8 g/dL (ref 3.9–4.9)
Alkaline Phosphatase: 78 IU/L (ref 44–121)
Bilirubin Total: 0.4 mg/dL (ref 0.0–1.2)
Bilirubin, Direct: 0.13 mg/dL (ref 0.00–0.40)
Total Protein: 7.2 g/dL (ref 6.0–8.5)

## 2023-06-28 MED ORDER — TERBINAFINE HCL 250 MG PO TABS: 250.0000 mg | ORAL_TABLET | Freq: Every day | ORAL | 0 refills | Status: AC

## 2023-06-28 NOTE — Addendum Note (Signed)
 Addended by: Nicholes Rough on: 06/28/2023 08:12 AM   Modules accepted: Orders

## 2023-07-14 ENCOUNTER — Encounter: Payer: Self-pay | Admitting: Family Medicine

## 2023-07-14 ENCOUNTER — Ambulatory Visit: Admitting: Family Medicine

## 2023-07-14 VITALS — BP 114/62 | HR 76 | Temp 98.2°F | Ht <= 58 in | Wt 172.0 lb

## 2023-07-14 DIAGNOSIS — R350 Frequency of micturition: Secondary | ICD-10-CM

## 2023-07-14 DIAGNOSIS — M545 Low back pain, unspecified: Secondary | ICD-10-CM

## 2023-07-14 DIAGNOSIS — R109 Unspecified abdominal pain: Secondary | ICD-10-CM

## 2023-07-14 LAB — CBC WITH DIFFERENTIAL/PLATELET
Basophils Absolute: 0 10*3/uL (ref 0.0–0.1)
Basophils Relative: 0.3 % (ref 0.0–3.0)
Eosinophils Absolute: 0.1 10*3/uL (ref 0.0–0.7)
Eosinophils Relative: 1.9 % (ref 0.0–5.0)
HCT: 36.5 % (ref 36.0–46.0)
Hemoglobin: 12.4 g/dL (ref 12.0–15.0)
Lymphocytes Relative: 34.5 % (ref 12.0–46.0)
Lymphs Abs: 2.3 10*3/uL (ref 0.7–4.0)
MCHC: 34 g/dL (ref 30.0–36.0)
MCV: 92.6 fl (ref 78.0–100.0)
Monocytes Absolute: 0.3 10*3/uL (ref 0.1–1.0)
Monocytes Relative: 5.3 % (ref 3.0–12.0)
Neutro Abs: 3.8 10*3/uL (ref 1.4–7.7)
Neutrophils Relative %: 58 % (ref 43.0–77.0)
Platelets: 264 10*3/uL (ref 150.0–400.0)
RBC: 3.94 Mil/uL (ref 3.87–5.11)
RDW: 13.4 % (ref 11.5–15.5)
WBC: 6.5 10*3/uL (ref 4.0–10.5)

## 2023-07-14 LAB — POC URINALSYSI DIPSTICK (AUTOMATED)
Bilirubin, UA: NEGATIVE
Glucose, UA: NEGATIVE
Ketones, UA: NEGATIVE
Nitrite, UA: NEGATIVE
Protein, UA: POSITIVE — AB
Spec Grav, UA: 1.025 (ref 1.010–1.025)
Urobilinogen, UA: 0.2 U/dL
pH, UA: 6 (ref 5.0–8.0)

## 2023-07-14 LAB — COMPREHENSIVE METABOLIC PANEL WITH GFR
ALT: 8 U/L (ref 0–35)
AST: 13 U/L (ref 0–37)
Albumin: 4.9 g/dL (ref 3.5–5.2)
Alkaline Phosphatase: 68 U/L (ref 39–117)
BUN: 12 mg/dL (ref 6–23)
CO2: 26 meq/L (ref 19–32)
Calcium: 9.7 mg/dL (ref 8.4–10.5)
Chloride: 104 meq/L (ref 96–112)
Creatinine, Ser: 0.63 mg/dL (ref 0.40–1.20)
GFR: 117.66 mL/min (ref >60.00)
Glucose, Bld: 111 mg/dL — ABNORMAL HIGH (ref 70–99)
Potassium: 3.7 meq/L (ref 3.5–5.1)
Sodium: 138 meq/L (ref 135–145)
Total Bilirubin: 0.4 mg/dL (ref 0.2–1.2)
Total Protein: 7.5 g/dL (ref 6.0–8.3)

## 2023-07-14 MED ORDER — NITROFURANTOIN MONOHYD MACRO 100 MG PO CAPS: 100.0000 mg | ORAL_CAPSULE | Freq: Two times a day (BID) | ORAL | 0 refills | Status: AC

## 2023-07-14 MED ORDER — SACCHAROMYCES BOULARDII 250 MG PO CAPS: 250.0000 mg | ORAL_CAPSULE | Freq: Every day | ORAL | 0 refills | Status: DC

## 2023-07-14 NOTE — Progress Notes (Signed)
 SUBJECTIVE:   Chief Complaint  Patient presents with   Back Pain    Lower right back pain since Monday, no fall or injury. Also urinating a lot.   HPI Presents for acute visit  Discussed the use of AI scribe software for clinical note transcription with the patient, who gave verbal consent to proceed.  History of Present Illness Kelly Roberts is a 33 year old female who presents with severe right-sided back pain and urinary symptoms.  She has been experiencing severe pain in her right backside that began earlier this week and has progressively worsened. The pain is localized to the right side and does not radiate to her groin. No recent falls, injuries, or heavy lifting.  She reports urinary symptoms, including frequent urination and a sensation of incomplete bladder emptying. No pain during urination, blood in the urine, nausea, vomiting, or fever. No history of kidney stones.  She is currently taking Topamax. Her blood pressure is reported as good. No abdominal pain and she does not have menstrual periods.    PERTINENT PMH / PSH: As above  OBJECTIVE:  BP 114/62 (Cuff Size: Large)   Pulse 76   Temp 98.2 F (36.8 C) (Oral)   Ht 4\' 8"  (1.422 m)   Wt 172 lb (78 kg)   SpO2 97%   BMI 38.56 kg/m    Physical Exam Vitals reviewed.  Constitutional:      General: She is not in acute distress.    Appearance: Normal appearance. She is obese. She is not ill-appearing, toxic-appearing or diaphoretic.  Eyes:     General:        Right eye: No discharge.        Left eye: No discharge.     Conjunctiva/sclera: Conjunctivae normal.  Cardiovascular:     Rate and Rhythm: Normal rate and regular rhythm.     Heart sounds: Normal heart sounds.  Pulmonary:     Effort: Pulmonary effort is normal.     Breath sounds: Normal breath sounds.  Abdominal:     General: Bowel sounds are normal.     Tenderness: There is no abdominal tenderness. There is no right CVA tenderness or left CVA  tenderness.  Musculoskeletal:        General: Normal range of motion.  Skin:    General: Skin is warm and dry.  Neurological:     General: No focal deficit present.     Mental Status: She is alert and oriented to person, place, and time. Mental status is at baseline.  Psychiatric:        Mood and Affect: Mood normal.        Behavior: Behavior normal.        Thought Content: Thought content normal.        Judgment: Judgment normal.           07/14/2023   12:01 PM 06/21/2023    3:06 PM 10/26/2022    9:08 AM 08/17/2021    3:07 PM 07/08/2020   10:19 AM  Depression screen PHQ 2/9  Decreased Interest 0 0 1 0 0  Down, Depressed, Hopeless 0 0 1 0 0  PHQ - 2 Score 0 0 2 0 0  Altered sleeping 0 0 1    Tired, decreased energy 0 1 1    Change in appetite 1 1 3     Feeling bad or failure about yourself  0 0 0    Trouble concentrating 0 0 3  Moving slowly or fidgety/restless 0 0 0    Suicidal thoughts 0 0 0    PHQ-9 Score 1 2 10     Difficult doing work/chores Not difficult at all Not difficult at all Very difficult        07/14/2023   12:01 PM 06/21/2023    3:06 PM 10/26/2022    9:09 AM  GAD 7 : Generalized Anxiety Score  Nervous, Anxious, on Edge 0 0 0  Control/stop worrying 0 0 0  Worry too much - different things 0 0 0  Trouble relaxing 0 0 0  Restless 0 0 0  Easily annoyed or irritable 0 1 0  Afraid - awful might happen 0 0 0  Total GAD 7 Score 0 1 0  Anxiety Difficulty Not difficult at all Not difficult at all Not difficult at all    ASSESSMENT/PLAN:  Urinary frequency Assessment & Plan: Frequent urination and incomplete bladder emptying. Urinalysis shows small Leuks, no nitrates.  Urine culture ordered. Antibiotics withheld unless symptoms worsen. - Send urine for culture. - Prescribe antibiotic if symptoms worsen can start over weekend - Advise use of phenazopyridine and cranberry juice for symptom relief. - Instruct to start probiotics if antibiotics are  initiated.  Urine culture positive for Staph sappro Continue with Macrobid  500 mg BID Continue with Probiotics daily  Orders: -     POCT Urinalysis Dipstick (Automated) -     Urine Culture -     Nitrofurantoin  Monohyd Macro; Take 1 capsule (100 mg total) by mouth 2 (two) times daily for 5 days.  Dispense: 10 capsule; Refill: 0 -     Saccharomyces boulardii; Take 1 capsule (250 mg total) by mouth daily.  Dispense: 90 capsule; Refill: 0 -     Comprehensive metabolic panel with GFR -     CBC with Differential/Platelet  Right flank pain Assessment & Plan: Flank pain localized to the right side, differential includes UTI or nephrolithiasis. Topamax use increases nephrolithiasis risk. Blood work to assess renal function and obstruction. Imaging if indicated by blood work. - Order blood work to assess renal function and potential obstruction. - Advise increased fluid intake. - Consider imaging if blood work indicates obstruction.      PDMP reviewed  Return if symptoms worsen or fail to improve, for PCP.  Valli Gaw, MD

## 2023-07-14 NOTE — Patient Instructions (Addendum)
 It was a pleasure meeting you today. Thank you for allowing me to take part in your health care.  Our goals for today as we discussed include:  Urine has inflammatory cells. Will send for further evaluation  If develops worsening symptoms over the weekend, fevers, lower abdominal pain, start Macrobid  1 tablet 2 times a day and take a probiotic daily.  We will get some labs today.  If they are abnormal or we need to do something about them, I will call you.  If they are normal, I will send you a message on MyChart (if it is active) or a letter in the mail.  If you don't hear from us  in 2 weeks, please call the office at the number below.   Increase water intake Can increase intake of cranberry juice     This is a list of the screening recommended for you and due dates:  Health Maintenance  Topic Date Due   COVID-19 Vaccine (1) Never done   Hepatitis C Screening  Never done   Pneumococcal Vaccination (2 of 2 - PCV) 07/10/2015   Pap Smear  08/18/2022   Flu Shot  10/20/2023   DTaP/Tdap/Td vaccine (4 - Td or Tdap) 05/13/2032   HPV Vaccine  Completed   HIV Screening  Completed   Meningitis B Vaccine  Aged Out      If you have any questions or concerns, please do not hesitate to call the office at [phone].  I look forward to our next visit and until then take care and stay safe.  Regards,   Valli Gaw, MD   [facility]

## 2023-07-16 LAB — URINE CULTURE
MICRO NUMBER:: 16376633
SPECIMEN QUALITY:: ADEQUATE

## 2023-07-20 ENCOUNTER — Ambulatory Visit: Admitting: Internal Medicine

## 2023-07-20 ENCOUNTER — Encounter: Payer: Self-pay | Admitting: Internal Medicine

## 2023-07-20 VITALS — BP 118/80 | HR 75 | Temp 98.5°F | Ht <= 58 in | Wt 173.0 lb

## 2023-07-20 DIAGNOSIS — J4520 Mild intermittent asthma, uncomplicated: Secondary | ICD-10-CM

## 2023-07-20 DIAGNOSIS — G4733 Obstructive sleep apnea (adult) (pediatric): Secondary | ICD-10-CM

## 2023-07-20 DIAGNOSIS — Z87891 Personal history of nicotine dependence: Secondary | ICD-10-CM | POA: Diagnosis not present

## 2023-07-20 NOTE — Progress Notes (Unsigned)
 [facility]  Pulmonary Medicine Consultation      Date: 07/20/2023,   MRN# [id number] Kelly Roberts 1990-10-20     CHIEF COMPLAINT:   Assessment of asthma   HISTORY OF PRESENT ILLNESS  33 year old pleasant African-American female seen today for assessment for asthma Patient states she was diagnosed with asthma 10 years ago Intermittent shortness of breath Intermittent chest tightness Intermittent wheezing Intermittent cough  At this time no symptoms patient is not on any maintenance therapy at this time,Patient uses albuterol  infrequently  No exacerbation at this time No evidence of heart failure at this time No evidence or signs of infection at this time No respiratory distress No fevers, chills, nausea, vomiting, diarrhea No evidence of lower extremity edema No evidence hemoptysis   Patient currently vapes Risks of vaping explained to patient in detail   Patient with a history of tobacco abuse smoked 1 pack a day for 10 years 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 Seems to be very active without any compromise of her breathing  Patient has a history of migraines sees neurology  Patient was also diagnosed with obstructive sleep apnea Patient seems to be compliant and her OSA is under control   Another reason patient is here is doing assessment for DMV Patient had a DUI and subsequently needed paperwork to fill out and assess her asthma as she is being asked to use a interlock device on her car  At this time I told her we do not perform PFTs with the office however we will need to schedule breathing tests with a methacholine challenge       PAST MEDICAL HISTORY   Past Medical History:  Diagnosis Date   Anemia    Asthma    Medical history non-contributory    Mental disorder    Vaginal Pap smear, abnormal      SURGICAL HISTORY   Past Surgical History:  Procedure Laterality Date   NO PAST SURGERIES       FAMILY HISTORY   Family  History  Problem Relation Age of Onset   Diabetes Mother    Healthy Father    Diabetes Maternal Grandfather    Diabetes Paternal Grandfather      SOCIAL HISTORY   Social History   Tobacco Use   Smoking status: Former    Current packs/day: 0.00    Average packs/day: 0.3 packs/day for 7.0 years (1.8 ttl pk-yrs)    Types: Cigarettes, E-cigarettes    Start date: 01/29/2013    Quit date: 01/30/2020    Years since quitting: 3.4   Smokeless tobacco: Never  Vaping Use   Vaping status: Every Day   Start date: 01/30/2020   Substances: Nicotine, Flavoring  Substance Use Topics   Alcohol use: Yes    Alcohol/week: 3.0 standard drinks of alcohol    Types: 3 Shots of liquor per week    Comment: ocassionally   Drug use: Not Currently    Types: Marijuana     MEDICATIONS    Home Medication:  Current Outpatient Rx   Order #: [id number] Class: Normal   Order #: [id number] Class: Normal   Order #: [id number] Class: Normal   Order #: [id number] Class: Historical Med   Order #: [id number] Class: Normal    Current Medication:  Current Outpatient Medications:    albuterol  (VENTOLIN  HFA) 108 (90 Base) MCG/ACT inhaler, Inhale 2 puffs into the lungs every 4 (four) hours as needed., Disp: 18 g, Rfl: 0   saccharomyces boulardii (FLORASTOR) 250 MG capsule, Take 1  capsule (250 mg total) by mouth daily., Disp: 90 capsule, Rfl: 0   terbinafine  (LAMISIL ) 250 MG tablet, Take 1 tablet (250 mg total) by mouth daily., Disp: 90 tablet, Rfl: 0   topiramate (TOPAMAX) 25 MG tablet, Take 25 mg by mouth daily., Disp: , Rfl:    Vitamin D , Ergocalciferol , (DRISDOL ) 1.25 MG (50000 UNIT) CAPS capsule, Take 1 capsule (50,000 Units total) by mouth every 7 (seven) days., Disp: 12 capsule, Rfl: 1    ALLERGIES   Lactose intolerance (gi)   BP 118/80 (BP Location: Right Arm, Patient Position: Sitting, Cuff Size: Normal)   Pulse 75   Temp 98.5 F (36.9 C) (Oral)   Ht 4\' 8"  (1.422 m)   Wt 173 lb (78.5 kg)   SpO2  96%   BMI 38.79 kg/m    Review of Systems: Gen:  Denies  fever, sweats, chills weight loss  HEENT: Denies blurred vision, double vision, ear pain, eye pain, hearing loss, nose bleeds, sore throat Cardiac:  No dizziness, chest pain or heaviness, chest tightness,edema, No JVD Resp:   No cough, -sputum production, -shortness of breath,-wheezing, -hemoptysis,  Other:  All other systems negative   Physical Examination:   General Appearance: No distress, nasal crease  EYES PERRLA, EOM intact.   NECK Supple, No JVD Pulmonary: normal breath sounds, No wheezing.  CardiovascularNormal S1,S2.  No m/r/g.   Abdomen: Benign, Soft, non-tender. Neurology UE/LE 5/5 strength, no focal deficits Ext pulses intact, cap refill intact ALL OTHER ROS ARE NEGATIVE        ASSESSMENT/PLAN   33 year old obese African-American female with underlying diagnosis of mild intermittent asthma along with obstructive sleep apnea with obesity and deconditioned state  Mild intermittent asthma No maintenance therapy needed at this time Avoid Allergens and Irritants Avoid secondhand smoke Avoid SICK contacts Recommend  Masking  when appropriate Recommend Keep up-to-date with vaccinations Use albuterol  as needed We will undergo pulmonary function testing with methacholine challenge for assessment for interlock device required by DMV  OSA Follow-up with neurology Compliance report reviewed on patient's phone Excellent compliance report great usage with AHI reduction to 0.8  Allergic rhinitis Recommend continuing Flonase daily  Obesity -recommend significant weight loss -recommend changing diet  Deconditioned state -Recommend increased daily activity and exercise  Smoking Assessment and Cessation Counseling Upon further questioning, Patient smokes/ VApes I have advised patient to quit/stop smoking as soon as possible due to high risk for multiple medical problems Patient  is NOT willing to quit  smoking I have advised patient that we can assist and have options of Nicotine replacement therapy. I also advised patient on behavioral therapy and can provide oral medication therapy in conjunction with the other therapies Follow up next Office visit  for assessment of smoking cessation Smoking cessation counseling advised for >10 minutes  MEDICATION ADJUSTMENTS/LABS AND TESTS ORDERED: Recommend obtaining pulmonary function testing with methacholine Recommend using albuterol  as needed Recommend using Flonase 2 sprays each nostril daily Avoid Allergens and Irritants Avoid secondhand smoke Avoid SICK contacts Recommend  Masking  when appropriate Recommend Keep up-to-date with vaccinations PLEASE STOP VAPING!! Continue CPAP as prescribed for sleep apnea   CURRENT MEDICATIONS REVIEWED AT LENGTH WITH PATIENT TODAY   Patient  satisfied with Plan of action and management. All questions answered  Follow up  3 months  I spent a total of 65 minutes reviewing chart data, face-to-face evaluation with the patient, counseling and coordination of care as detailed above.     Lady Pier, M.D.  [facility]  Medical Director [facility] Medical Director [facility] Cardio-Pulmonary Department

## 2023-07-20 NOTE — Patient Instructions (Addendum)
 Recommend obtaining pulmonary function testing with methacholine Recommend using albuterol  as needed Recommend using Flonase 2 sprays each nostril daily  Avoid Allergens and Irritants Avoid secondhand smoke Avoid SICK contacts Recommend  Masking  when appropriate Recommend Keep up-to-date with vaccinations    PLEASE STOP VAPING!!  Continue CPAP as prescribed for sleep apnea

## 2023-07-23 ENCOUNTER — Encounter: Payer: Self-pay | Admitting: Family Medicine

## 2023-07-23 DIAGNOSIS — R350 Frequency of micturition: Secondary | ICD-10-CM | POA: Insufficient documentation

## 2023-07-23 DIAGNOSIS — R109 Unspecified abdominal pain: Secondary | ICD-10-CM | POA: Insufficient documentation

## 2023-07-23 NOTE — Assessment & Plan Note (Signed)
 Flank pain localized to the right side, differential includes UTI or nephrolithiasis. Topamax use increases nephrolithiasis risk. Blood work to assess renal function and obstruction. Imaging if indicated by blood work. - Order blood work to assess renal function and potential obstruction. - Advise increased fluid intake. - Consider imaging if blood work indicates obstruction.

## 2023-07-23 NOTE — Assessment & Plan Note (Signed)
 Frequent urination and incomplete bladder emptying. Urinalysis shows small Leuks, no nitrates.  Urine culture ordered. Antibiotics withheld unless symptoms worsen. - Send urine for culture. - Prescribe antibiotic if symptoms worsen can start over weekend - Advise use of phenazopyridine and cranberry juice for symptom relief. - Instruct to start probiotics if antibiotics are initiated.  Urine culture positive for Staph sappro Continue with Macrobid  500 mg BID Continue with Probiotics daily

## 2023-07-25 ENCOUNTER — Other Ambulatory Visit: Payer: Self-pay

## 2023-07-25 DIAGNOSIS — J4520 Mild intermittent asthma, uncomplicated: Secondary | ICD-10-CM

## 2023-08-25 ENCOUNTER — Encounter: Payer: Self-pay | Admitting: Emergency Medicine

## 2023-08-25 ENCOUNTER — Ambulatory Visit: Admission: EM | Admit: 2023-08-25 | Discharge: 2023-08-25 | Disposition: A | Discharge status: Home / Self Care

## 2023-08-25 ENCOUNTER — Ambulatory Visit (INDEPENDENT_AMBULATORY_CARE_PROVIDER_SITE_OTHER)

## 2023-08-25 DIAGNOSIS — R0981 Nasal congestion: Secondary | ICD-10-CM | POA: Diagnosis not present

## 2023-08-25 DIAGNOSIS — R051 Acute cough: Secondary | ICD-10-CM

## 2023-08-25 DIAGNOSIS — B349 Viral infection, unspecified: Secondary | ICD-10-CM

## 2023-08-25 DIAGNOSIS — R509 Fever, unspecified: Secondary | ICD-10-CM | POA: Diagnosis not present

## 2023-08-25 DIAGNOSIS — J45909 Unspecified asthma, uncomplicated: Secondary | ICD-10-CM

## 2023-08-25 MED ORDER — PROMETHAZINE-DM 6.25-15 MG/5ML PO SYRP: 5.0000 mL | ORAL_SOLUTION | Freq: Four times a day (QID) | ORAL | 0 refills | Status: DC | PRN

## 2023-08-25 MED ORDER — IPRATROPIUM BROMIDE 0.06 % NA SOLN: 2.0000 | Freq: Four times a day (QID) | NASAL | 0 refills | Status: AC

## 2023-08-25 NOTE — ED Triage Notes (Signed)
 Patient c/o cough, nasal congestion, chest congestion and sinus pressure for 4-5 days.  Patient usnure of fevers.  Patient has been taking her allergy medicine daily.

## 2023-08-25 NOTE — Discharge Instructions (Addendum)
-  Chest xray is normal. You do not have pneumonia. I did not note any wheezing on exam so I do not believe you are having a bad asthma exacerbation. Continue using inhalers and if breathing worsens, please return for re-evaluation. -I sent cough meds and a nasal spray to pharmacy to help with symptoms -Symptoms related to viral illnesses can last a couple of weeks but you should return if you are not breaking the fever in a few days.
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
## 2023-08-29 ENCOUNTER — Other Ambulatory Visit: Payer: Self-pay | Admitting: Family Medicine

## 2023-08-29 DIAGNOSIS — E559 Vitamin D deficiency, unspecified: Secondary | ICD-10-CM

## 2023-09-12 ENCOUNTER — Ambulatory Visit (INDEPENDENT_AMBULATORY_CARE_PROVIDER_SITE_OTHER): Admitting: Family Medicine

## 2023-09-12 ENCOUNTER — Encounter: Payer: Self-pay | Admitting: Family Medicine

## 2023-09-12 VITALS — BP 122/74 | HR 79 | Temp 98.6°F | Ht <= 58 in | Wt 170.6 lb

## 2023-09-12 DIAGNOSIS — R202 Paresthesia of skin: Secondary | ICD-10-CM | POA: Insufficient documentation

## 2023-09-12 DIAGNOSIS — M6740 Ganglion, unspecified site: Secondary | ICD-10-CM | POA: Diagnosis not present

## 2023-09-12 NOTE — Patient Instructions (Signed)
 Likely a ganglion cyst.  Wouldn't need intervention unless more painful.   I would try a carpal tunnel brace at night and see if that helps.   Take care.  Glad to see you.

## 2023-09-12 NOTE — Assessment & Plan Note (Signed)
 W/o weakness.  Likely CTS.  D/w pt.   I would try a carpal tunnel brace at night and see if that helps.

## 2023-09-12 NOTE — Assessment & Plan Note (Signed)
 Likely a ganglion cyst.  Wouldn't need intervention unless more painful.  Anatomy d/w pt.  Update us  as needed.   Routed to PCP as FYI.

## 2023-09-12 NOTE — Progress Notes (Signed)
 Lesion on the dorsum on L wrist.  Recently noted, a few days ago.  Quick onset.  No trauma.  No trigger.  Not painful but some discomfort with a lot of hand movement at baseline since pregnancy, waking up with numbness in the hands.  R handed.  Working with repetitive movements.    Son EJ born 07/15/22.    Meds, vitals, and allergies reviewed.   ROS: Per HPI unless specifically indicated in ROS section   Nad Ncat Rrr Normal B grip.   Altered sensation to monofilament (not absent) L 1st through 4th fingers. Normal B hand flex and extension.  Normal cap refill.    Tinel neg B wrists.   Dorsal ganglion cyst on the L wrist, with positive transillumination.  No ulceration or erythema.  Normal B radial pulses.

## 2023-10-25 ENCOUNTER — Telehealth: Payer: Self-pay | Admitting: Family Medicine

## 2023-10-25 NOTE — Telephone Encounter (Signed)
 Left message and sent MyChart message to call and schedule a TOC with either Leron Glance or Chelsea Aurora, NP.

## 2023-11-02 ENCOUNTER — Ambulatory Visit: Admitting: Internal Medicine

## 2023-11-23 ENCOUNTER — Telehealth: Payer: Self-pay

## 2023-11-23 DIAGNOSIS — R0602 Shortness of breath: Secondary | ICD-10-CM

## 2023-11-23 NOTE — Telephone Encounter (Signed)
 Please schedule 2 appts with 2 different providers. She will also need to have PFTs done before these appts. If you will let me know which providers she is seeing I can order the PFTs.

## 2023-11-23 NOTE — Telephone Encounter (Signed)
 Copied from CRM [id number]. Topic: General - Other >> Nov 22, 2023  2:49 PM Kelly Roberts wrote: Reason for CRM: patient is calling because [facility] needs her to take two different pft test by two different drs to prove she has astma. Please call to schedule after 4pm today because she will be off of work then

## 2023-11-28 NOTE — Telephone Encounter (Addendum)
 I have ordered both PFTs.  Nothing further needed.

## 2023-12-05 ENCOUNTER — Ambulatory Visit (INDEPENDENT_AMBULATORY_CARE_PROVIDER_SITE_OTHER): Payer: Self-pay | Admitting: Pulmonary Disease

## 2023-12-05 DIAGNOSIS — J4520 Mild intermittent asthma, uncomplicated: Secondary | ICD-10-CM

## 2023-12-05 DIAGNOSIS — R0602 Shortness of breath: Secondary | ICD-10-CM

## 2023-12-05 LAB — PULMONARY FUNCTION TEST
DL/VA % pred: 124 %
DL/VA: 6.04 ml/min/mmHg/L
DLCO unc % pred: 128 %
DLCO unc: 21.58 ml/min/mmHg
FEF 25-75 Post: 2.99 L/s
FEF 25-75 Pre: 2.66 L/s
FEF2575-%Change-Post: 12 %
FEF2575-%Pred-Post: 100 %
FEF2575-%Pred-Pre: 89 %
FEV1-%Change-Post: 2 %
FEV1-%Pred-Post: 102 %
FEV1-%Pred-Pre: 100 %
FEV1-Post: 2.51 L
FEV1-Pre: 2.45 L
FEV1FVC-%Change-Post: 1 %
FEV1FVC-%Pred-Pre: 101 %
FEV6-%Change-Post: 1 %
FEV6-%Pred-Post: 102 %
FEV6-%Pred-Pre: 101 %
FEV6-Post: 2.92 L
FEV6-Pre: 2.88 L
FEV6FVC-%Pred-Post: 100 %
FEV6FVC-%Pred-Pre: 100 %
FVC-%Change-Post: 1 %
FVC-%Pred-Post: 102 %
FVC-%Pred-Pre: 101 %
FVC-Post: 2.92 L
FVC-Pre: 2.88 L
Post FEV1/FVC ratio: 86 %
Post FEV6/FVC ratio: 100 %
Pre FEV1/FVC ratio: 85 %
Pre FEV6/FVC Ratio: 100 %
RV % pred: 112 %
RV: 1.15 L
TLC % pred: 102 %
TLC: 3.95 L

## 2023-12-05 NOTE — Patient Instructions (Signed)
 Full PFT completed today ? ?

## 2023-12-05 NOTE — Progress Notes (Signed)
 Full PFT completed today ? ?

## 2023-12-06 ENCOUNTER — Encounter: Payer: Self-pay | Admitting: Pulmonary Disease

## 2023-12-06 ENCOUNTER — Ambulatory Visit: Payer: Self-pay | Admitting: Pulmonary Disease

## 2023-12-06 VITALS — BP 90/60 | HR 88 | Temp 98.5°F | Ht <= 58 in | Wt 169.0 lb

## 2023-12-06 DIAGNOSIS — J4520 Mild intermittent asthma, uncomplicated: Secondary | ICD-10-CM
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
## 2023-12-13 ENCOUNTER — Ambulatory Visit: Payer: Self-pay | Admitting: Internal Medicine

## 2023-12-13 DIAGNOSIS — R0602 Shortness of breath: Secondary | ICD-10-CM

## 2023-12-13 LAB — PULMONARY FUNCTION TEST
DL/VA % pred: 124 %
DL/VA: 6 ml/min/mmHg/L
DLCO unc % pred: 128 %
DLCO unc: 21.53 ml/min/mmHg
FEF 25-75 Post: 2.63 L/s
FEF 25-75 Pre: 2.58 L/s
FEF2575-%Change-Post: 1 %
FEF2575-%Pred-Post: 88 %
FEF2575-%Pred-Pre: 86 %
FEV1-%Change-Post: 0 %
FEV1-%Pred-Post: 97 %
FEV1-%Pred-Pre: 97 %
FEV1-Post: 2.38 L
FEV1-Pre: 2.38 L
FEV1FVC-%Change-Post: 1 %
FEV1FVC-%Pred-Pre: 100 %
FEV6-%Change-Post: -1 %
FEV6-%Pred-Post: 97 %
FEV6-%Pred-Pre: 99 %
FEV6-Post: 2.78 L
FEV6-Pre: 2.81 L
FEV6FVC-%Pred-Post: 100 %
FEV6FVC-%Pred-Pre: 100 %
FVC-%Change-Post: -1 %
FVC-%Pred-Post: 97 %
FVC-%Pred-Pre: 98 %
FVC-Post: 2.78 L
FVC-Pre: 2.81 L
Post FEV1/FVC ratio: 86 %
Post FEV6/FVC ratio: 100 %
Pre FEV1/FVC ratio: 85 %
Pre FEV6/FVC Ratio: 100 %
RV % pred: 122 %
RV: 1.25 L
TLC % pred: 105 %
TLC: 4.06 L

## 2023-12-13 NOTE — Patient Instructions (Signed)
 Full PFT completed today ? ?

## 2023-12-13 NOTE — Progress Notes (Signed)
 Full PFT completed today ? ?

## 2023-12-14 ENCOUNTER — Ambulatory Visit: Payer: Self-pay | Admitting: Internal Medicine

## 2023-12-14 ENCOUNTER — Encounter: Payer: Self-pay | Admitting: Internal Medicine

## 2023-12-14 VITALS — BP 90/60 | HR 84 | Temp 98.5°F | Ht <= 58 in | Wt 172.8 lb

## 2023-12-14 DIAGNOSIS — Z6838 Body mass index (BMI) 38.0-38.9, adult: Secondary | ICD-10-CM

## 2023-12-14 DIAGNOSIS — J4520 Mild intermittent asthma, uncomplicated: Secondary | ICD-10-CM

## 2023-12-14 DIAGNOSIS — G4733 Obstructive sleep apnea (adult) (pediatric): Secondary | ICD-10-CM

## 2023-12-14 DIAGNOSIS — Z87891 Personal history of nicotine dependence: Secondary | ICD-10-CM

## 2023-12-14 DIAGNOSIS — J309 Allergic rhinitis, unspecified: Secondary | ICD-10-CM

## 2023-12-14 DIAGNOSIS — E669 Obesity, unspecified: Secondary | ICD-10-CM

## 2023-12-14 NOTE — Progress Notes (Signed)
 [facility] Consultation      Date: 12/14/2023,   MRN# [id number] Kelly Roberts Jan 10, 1991     CHIEF COMPLAINT:   Assessment of asthma   HISTORY OF PRESENT ILLNESS  33 year old pleasant African-American female seen today for assessment for asthma Patient states she was diagnosed with asthma 10 years ago No exacerbation at this time No evidence of heart failure at this time No evidence or signs of infection at this time No respiratory distress No fevers, chills, nausea, vomiting, diarrhea No evidence of lower extremity edema No evidence hemoptysis  At this time no symptoms patient is not on any maintenance therapy at this time,Patient uses albuterol  infrequently   PFTs done on 12/06/2023 were normal with normal spirometry.  Normal peak expiratory flow rate.  Normal lung volumes.  Normal diffusing capacity.   PFTs completed on December 13, 2023 Postbronchodilator FEV1 FVC ratio is 86% predicted FEV1 is 97% predicted FVC is 97% predicted No significant bronchodilator response TLC is 105% predicted RV is 122% predicted DLCO is 128% predicted Good patient effort and reproducibility No significant abnormalities in the flow-volume loops Findings are consistent with normal pulmonary function testing   Patient currently vapes Risks of vaping explained to patient in detail   assessment for [name] Paper work filled for [name], patient has normal PFT and there are no restrictions at this time. I recommend she stop VAPING ASAP     PAST MEDICAL HISTORY   Past Medical History:  Diagnosis Date   Anemia    Asthma    Medical history non-contributory    Mental disorder    Vaginal Pap smear, abnormal      SURGICAL HISTORY   Past Surgical History:  Procedure Laterality Date   NO PAST SURGERIES       FAMILY HISTORY   Family History  Problem Relation Age of Onset   Diabetes Mother    Healthy Father    Diabetes Maternal Grandfather    Diabetes  Paternal Grandfather      SOCIAL HISTORY   Social History   Tobacco Use   Smoking status: Former    Current packs/day: 0.00    Average packs/day: 0.3 packs/day for 7.0 years (1.8 ttl pk-yrs)    Types: Cigarettes, E-cigarettes    Start date: 01/29/2013    Quit date: 01/30/2020    Years since quitting: 3.8   Smokeless tobacco: Never  Vaping Use   Vaping status: Every Day   Start date: 01/30/2020   Substances: Nicotine, Flavoring  Substance Use Topics   Alcohol use: Yes    Alcohol/week: 3.0 standard drinks of alcohol    Types: 3 Shots of liquor per week    Comment: ocassionally   Drug use: Not Currently    Types: Marijuana     MEDICATIONS    Home Medication:  Current Outpatient Rx   Order #: [id number] Class: Normal   Order #: [id number] Class: Historical Med   Order #: [id number] Class: Normal   Order #: [id number] Class: Historical Med   Order #: [id number] Class: Normal   Order #: [id number] Class: Historical Med   Order #: [id number] Class: Normal    Current Medication:  Current Outpatient Medications:    albuterol  (VENTOLIN  HFA) 108 (90 Base) MCG/ACT inhaler, Inhale 2 puffs into the lungs every 4 (four) hours as needed., Disp: 18 g, Rfl: 0   fluticasone (FLONASE) 50 MCG/ACT nasal spray, Place 1 spray into both nostrils daily., Disp: , Rfl:    ipratropium (ATROVENT ) 0.06 % nasal spray, Place 2  sprays into both nostrils 4 (four) times daily., Disp: 15 mL, Rfl: 0   levonorgestrel (MIRENA) 20 MCG/DAY IUD, 1 each by Intrauterine route once., Disp: , Rfl:    terbinafine  (LAMISIL ) 250 MG tablet, Take 1 tablet (250 mg total) by mouth daily., Disp: 90 tablet, Rfl: 0   topiramate (TOPAMAX) 25 MG tablet, Take 25 mg by mouth daily., Disp: , Rfl:    Vitamin D , Ergocalciferol , (DRISDOL ) 1.25 MG (50000 UNIT) CAPS capsule, TAKE 1 CAPSULE (50,000 UNITS TOTAL) BY MOUTH EVERY 7 (SEVEN) DAYS, Disp: 12 capsule, Rfl: 1    ALLERGIES   Lactose intolerance (gi)   BP 90/60   Pulse 84   Temp  98.5 F (36.9 C)   Ht 4' 8 (1.422 m)   Wt 172 lb 12.8 oz (78.4 kg)   LMP  (LMP Unknown)   SpO2 96%   BMI 38.74 kg/m       Physical Examination:   General Appearance: No distress  EYES PERRLA, EOM intact.   NECK Supple, No JVD Pulmonary: normal breath sounds, No wheezing.  CardiovascularNormal S1,S2.  No m/r/g.   Abdomen: Benign, Soft, non-tender. Neurology UE/LE 5/5 strength, no focal deficits Ext pulses intact, cap refill intact ALL OTHER ROS ARE NEGATIVE       ASSESSMENT/PLAN   33 year old obese African-American female with underlying diagnosis of mild intermittent asthma along with obstructive sleep apnea with obesity and deconditioned state  Mild intermittent asthma Pulmonary function testing September 17 and September 24 have no significant abnormalities with normal findings No maintenance therapy needed at this time Avoid Allergens and Irritants Avoid secondhand smoke Avoid SICK contacts Recommend  Masking  when appropriate Recommend Keep up-to-date with vaccinations Use albuterol  as needed  OSA Follow-up with neurology Compliance report reviewed on patient's phone Excellent compliance report great usage with AHI reduction to 0.8  Allergic rhinitis Recommend continuing Flonase daily  Obesity -recommend significant weight loss -recommend changing diet  Deconditioned state -Recommend increased daily activity and exercise  MEDICATION ADJUSTMENTS/LABS AND TESTS ORDERED: Recommend using albuterol  as needed Recommend using Flonase 2 sprays each nostril daily Avoid Allergens and Irritants Avoid secondhand smoke Avoid SICK contacts Recommend  Masking  when appropriate Recommend Keep up-to-date with vaccinations PLEASE STOP VAPING!! Continue CPAP as prescribed for sleep apnea   MEDICATION ADJUSTMENTS/LABS AND TESTS ORDERED:   Patient  satisfied with Plan of action and management. All questions answered   Follow up 1 year   I spent a total  of 42 minutes dedicated to the care of this patient on the date of this encounter to include pre-visit review of records, face-to-face time with the patient discussing conditions above, post visit ordering of testing, clinical documentation with the electronic health record, making appropriate referrals as documented, and communicating necessary information to the patient's healthcare team.    The Patient requires high complexity decision making for assessment and support, frequent evaluation and titration of therapies, application of advanced monitoring technologies and extensive interpretation of multiple databases.  Patient satisfied with Plan of action and management. All questions answered    Nickolas Alm Cellar, M.D.  [facility]  Medical Director [facility] Medical Director [facility] Cardio-Pulmonary Department

## 2023-12-14 NOTE — Patient Instructions (Addendum)
 Avoid Allergens and Irritants Avoid secondhand smoke Avoid SICK contacts Recommend  Masking  when appropriate Recommend Keep up-to-date with vaccinations   STOP VAPING!!!
